# Patient Record
Sex: Female | Born: 1960 | Race: White | Hispanic: No | Smoking: Never smoker
Health system: Southern US, Community
[De-identification: ages and names within clinical notes are randomized; demographics above are authoritative.]

## PROBLEM LIST (undated history)

## (undated) DIAGNOSIS — F419 Anxiety disorder, unspecified: Secondary | ICD-10-CM

## (undated) DIAGNOSIS — R55 Syncope and collapse: Secondary | ICD-10-CM

## (undated) DIAGNOSIS — D649 Anemia, unspecified: Secondary | ICD-10-CM

## (undated) DIAGNOSIS — F32A Depression, unspecified: Secondary | ICD-10-CM

## (undated) DIAGNOSIS — E7800 Pure hypercholesterolemia, unspecified: Secondary | ICD-10-CM

## (undated) DIAGNOSIS — K219 Gastro-esophageal reflux disease without esophagitis: Secondary | ICD-10-CM

## (undated) DIAGNOSIS — IMO0002 Reserved for concepts with insufficient information to code with codable children: Secondary | ICD-10-CM

## (undated) DIAGNOSIS — F329 Major depressive disorder, single episode, unspecified: Secondary | ICD-10-CM

## (undated) DIAGNOSIS — R12 Heartburn: Secondary | ICD-10-CM

## (undated) DIAGNOSIS — I959 Hypotension, unspecified: Secondary | ICD-10-CM

## (undated) DIAGNOSIS — K589 Irritable bowel syndrome without diarrhea: Secondary | ICD-10-CM

## (undated) HISTORY — DX: Irritable bowel syndrome, unspecified: K58.9

## (undated) HISTORY — PX: TONSILLECTOMY AND ADENOIDECTOMY: SUR1326

## (undated) HISTORY — DX: Pure hypercholesterolemia, unspecified: E78.00

## (undated) HISTORY — DX: Anemia, unspecified: D64.9

## (undated) HISTORY — DX: Reserved for concepts with insufficient information to code with codable children: IMO0002

## (undated) HISTORY — DX: Gastro-esophageal reflux disease without esophagitis: K21.9

## (undated) HISTORY — DX: Anxiety disorder, unspecified: F41.9

## (undated) HISTORY — DX: Heartburn: R12

---

## 2003-05-03 HISTORY — PX: CARPAL TUNNEL RELEASE: SHX101

## 2003-10-09 ENCOUNTER — Ambulatory Visit (HOSPITAL_BASED_OUTPATIENT_CLINIC_OR_DEPARTMENT_OTHER): Admission: RE | Admit: 2003-10-09 | Discharge: 2003-10-09 | Payer: Self-pay | Admitting: Orthopedic Surgery

## 2006-11-20 ENCOUNTER — Encounter: Payer: Self-pay | Admitting: Internal Medicine

## 2007-05-28 ENCOUNTER — Encounter: Payer: Self-pay | Admitting: Internal Medicine

## 2007-05-30 ENCOUNTER — Encounter: Payer: Self-pay | Admitting: Internal Medicine

## 2008-07-31 ENCOUNTER — Encounter: Payer: Self-pay | Admitting: Internal Medicine

## 2008-09-12 ENCOUNTER — Encounter: Payer: Self-pay | Admitting: Internal Medicine

## 2009-07-01 ENCOUNTER — Encounter: Payer: Self-pay | Admitting: Internal Medicine

## 2009-07-31 ENCOUNTER — Encounter: Payer: Self-pay | Admitting: Internal Medicine

## 2009-08-05 DIAGNOSIS — R55 Syncope and collapse: Secondary | ICD-10-CM | POA: Insufficient documentation

## 2009-08-05 DIAGNOSIS — F329 Major depressive disorder, single episode, unspecified: Secondary | ICD-10-CM

## 2009-08-05 DIAGNOSIS — F32A Depression, unspecified: Secondary | ICD-10-CM | POA: Insufficient documentation

## 2009-08-07 ENCOUNTER — Ambulatory Visit: Payer: Self-pay | Admitting: Internal Medicine

## 2009-08-18 ENCOUNTER — Encounter: Payer: Self-pay | Admitting: Internal Medicine

## 2009-08-18 ENCOUNTER — Ambulatory Visit: Payer: Self-pay

## 2009-08-18 ENCOUNTER — Ambulatory Visit: Payer: Self-pay | Admitting: Cardiovascular Disease

## 2009-08-18 ENCOUNTER — Ambulatory Visit (HOSPITAL_COMMUNITY): Admission: RE | Admit: 2009-08-18 | Discharge: 2009-08-18 | Payer: Self-pay | Admitting: Internal Medicine

## 2009-08-25 DIAGNOSIS — R87619 Unspecified abnormal cytological findings in specimens from cervix uteri: Secondary | ICD-10-CM

## 2009-08-25 DIAGNOSIS — IMO0002 Reserved for concepts with insufficient information to code with codable children: Secondary | ICD-10-CM

## 2009-08-25 HISTORY — DX: Unspecified abnormal cytological findings in specimens from cervix uteri: R87.619

## 2009-08-25 HISTORY — DX: Reserved for concepts with insufficient information to code with codable children: IMO0002

## 2009-11-16 ENCOUNTER — Encounter (INDEPENDENT_AMBULATORY_CARE_PROVIDER_SITE_OTHER): Payer: Self-pay | Admitting: *Deleted

## 2010-06-01 NOTE — Letter (Signed)
Summary: [facility] Note  [facility] Office Note   Imported By: Roderic Ovens 08/12/2009 10:53:04  _____________________________________________________________________  External Attachment:    Type:   Image     Comment:   External Document

## 2010-06-01 NOTE — Letter (Signed)
Summary: [facility] Office Note 2006 - 2008  [facility] Office Note 2006 - 2008   Imported By: Roderic Ovens 08/12/2009 10:55:00  _____________________________________________________________________  External Attachment:    Type:   Image     Comment:   External Document

## 2010-06-01 NOTE — Letter (Signed)
Summary: [facility]  [facility] Note   Imported By: Roderic Ovens 08/12/2009 10:56:55  _____________________________________________________________________  External Attachment:    Type:   Image     Comment:   External Document

## 2010-06-01 NOTE — Letter (Signed)
Summary: [facility] Note  [facility] Note   Imported By: Roderic Ovens 08/12/2009 10:51:19  _____________________________________________________________________  External Attachment:    Type:   Image     Comment:   External Document

## 2010-06-01 NOTE — Letter (Signed)
Summary: Appointment - Missed  [facility], Main Office  [address]   [place], [place] [address]   Phone: [phone]  Fax: [phone]     November 16, 2009 MRN: [id number]   HANIA CERONE [address] [place], [place]  [address]   Dear Ms. Dougher,  Our records indicate you missed your appointment on 11/04/09 with Dr Johney Frame. It is very important that we reach you to reschedule this appointment. We look forward to participating in your health care needs. Please contact us at the number listed above at your earliest convenience to reschedule this appointment.     Sincerely,   Ruel Favors Scheduling Team

## 2010-06-01 NOTE — Letter (Signed)
Summary: [facility] Note  [facility] Office Note   Imported By: Roderic Ovens 08/12/2009 10:52:37  _____________________________________________________________________  External Attachment:    Type:   Image     Comment:   External Document

## 2010-06-01 NOTE — Assessment & Plan Note (Signed)
Summary: nep/syncope/per [name]/jss   Visit Type:  Initial Consult Primary Provider:  Janae Sauce, MD  CC:  syncope.  History of Present Illness: Ms Rebecca Summers is a pleasant 50 yo WF with recurrent syncope who presents today for EP consultation.  She reports having her first episode of syncope 1992 while sitting on the couch when her father died.  She did well until 2-3 years ago.  He reports that while having severe abdominal cramping and walking to the bathroom during the middle of the night, she had brief LOC.  She then was at the Vets office with her dog.  She observe the dog have pain.  She became diaphoretic and had LOC.  She reports that it took her several minutes to regain composure. Her most recent episode of syncope occured 1 month ago.  She reports having epigastric discomfort after eating dinner.  She was putting away dishes while having epigastric pain.  She bent over to pickup something on the floor when she became lightheaded.  She went to sit down and had loss of consciousness.  She fell and hit her head.  It took her several minutes to regain composure.  Her spouse was very scared because he had difficulty arousing her.  She says that her spouse found that she was making "strange noises" but did not observe seizure activity.  She did not bite her tongue and did not have incontinence.  She then regained composure and had a neighbor who was a nurse come over to the house.  She reports that her vitals were "ok" at that time.  She has had no further episodes of syncope since that time.   She denies palpitations, orthopnea, PND, edema, or other symptoms. She exercises regularly without exertional chest pain or SOB.  Current Medications (verified): 1)  Benadryl 25 Mg Tabs (Diphenhydramine Hcl) .... As Needed 2)  Zyrtec Allergy 10 Mg Tbdp (Cetirizine Hcl) .... 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 Works for [employer]. Tobacco Use - No.  Alcohol Use - yes 1 beverage per week Regular Exercise - yes Drug Use - no  Review of Systems       All systems are reviewed and negative except as listed in the HPI.  In addition, she reports IBS type sypmtoms of lose/ watery stools.  She also reports significant anxiety.   Vital Signs:  Patient profile:   50 year old female Height:      64 inches Weight:      157 pounds BMI:     27.05 Pulse rate:   66 / minute BP sitting:   120 / 80  (left arm)  Vitals Entered By: Laurance Flatten CMA (August 07, 2009 11:00 AM)  Physical Exam  General:  Well developed, well nourished, in no acute distress. Head:  normocephalic and atraumatic Eyes:  PERRLA/EOM intact; conjunctiva and lids normal. Mouth:  Teeth, gums and palate normal. Oral mucosa normal. Neck:  Neck supple, no JVD. No masses, thyromegaly or abnormal cervical nodes. Lungs:  Clear bilaterally to auscultation and percussion. Heart:  Non-displaced PMI, chest non-tender; regular  rate and rhythm, S1, S2 without murmurs, rubs or gallops. Carotid upstroke normal, no bruit. Normal abdominal aortic size, no bruits. Femorals normal pulses, no bruits. Pedals normal pulses. No edema, no varicosities. Abdomen:  Bowel sounds positive; abdomen soft and non-tender without masses, organomegaly, or hernias noted. No hepatosplenomegaly. Msk:  Back normal, normal gait. Muscle strength and tone normal. Pulses:  pulses normal in all 4 extremities Extremities:  No clubbing or cyanosis. Neurologic:  Alert and oriented x 3.  strength/sensation are  intact Skin:  Intact without lesions or rashes. Cervical Nodes:  no significant adenopathy Psych:  Normal affect.   EKG  Procedure date:  08/07/2009  Findings:      sinus rhythm 66 bpm, PR 210, QT 410, otherwise normal ekg  Impression & Recommendations:  Problem # 1:  SYNCOPE (ICD-780.2) The patient has a h/o recurrent syncope.  Her history supports vasovagal syncope.  She has PR prolongation by EKG suggesting hightened vagal tone.  She has no family history of arrhythmia or sudden death.  At this point, I would recommend a TTE to evaluate for structual heart disease.  If this is normal, then I would not recommend futher testing right now.  We discussed tilt testing as a possibility, however, pt wishes to defer tilt testing.  I am not sure that tilt testing would offer much additional information at this time.  Lifestyle modification including increased salt intact and adequate hydration were discussed.  In addition, the patient was made aware of DMV's recommendation of no driving for 6 months following syncope.  I will see her again in 3 months. She will contact my office if further syncope occurs.  Other Orders: Echocardiogram (Echo)  Patient Instructions: 1)  Your physician recommends that you schedule a follow-up appointment in: 3 months with Dr Johney Frame 2)  Your physician has requested that you have an echocardiogram.  Echocardiography is a painless test that uses sound waves to create images of your heart. It provides your doctor with information about the size and shape of your heart and how well your heart's chambers and valves are working.  This procedure takes approximately one hour. There are no restrictions for this procedure. 3)  Increase salts and fluids in diet

## 2010-09-17 NOTE — Op Note (Signed)
NAMEPERRI, Rebecca Summers                           ACCOUNT NO.:  [id number]   MEDICAL RECORD NO.:  [id number]                   PATIENT TYPE:  AMB   LOCATION:  [facility]                                  FACILITY:  [facility]   PHYSICIAN:  Katy Fitch. Naaman Plummer., M.D.          DATE OF BIRTH:  Jun 13, 1960   DATE OF PROCEDURE:  10/09/2003  DATE OF DISCHARGE:                                 OPERATIVE REPORT   PREOPERATIVE DIAGNOSES:  Entrapment neuropathy of median nerve, right carpal  tunnel.   POSTOPERATIVE DIAGNOSES:  Entrapment neuropathy of median nerve, right  carpal tunnel.   OPERATION PERFORMED:  Release of right transverse carpal ligament.   SURGEON:  Katy Fitch. Sypher, M.D.   ASSISTANT:  Jonni Sanger, P.A.   ANESTHESIA:  General by LMA.   SUPERVISING ANESTHESIOLOGIST:  Bedelia Person, M.D.   INDICATIONS FOR PROCEDURE:  Toshia Larkin is a 50 year old woman referred  for hand discomfort and numbness.  Clinical examination suggested carpal  tunnel syndrome.  Electrodiagnostic studies completed by Laurier Nancy, M.D. revealed rather severe right carpal tunnel syndrome.  Due to  failure to respond to nonoperative measures, the patient is brought to the  operating room at this time for release of her right transverse carpal  ligament.   DESCRIPTION OF PROCEDURE:  Layan Zalenski was brought to the operating room  and placed in supine position upon the operating table.  Following induction  of general anesthesia by LMA, the right arm was prepped with Betadine soap  and solution and sterilely draped.  Following exsanguination of the right  arm with an Esmarch bandage, an arterial tourniquet on the proximal brachium  was inflated to 220 mmHg.  The procedure commenced with a short incision in  the line of the ring finger in the palm.  Subcutaneous tissues are carefully  divided revealing the palmar fascia.  This was split longitudinally to  reveal the common sensory branch of the median  nerve and the superficial  palmar arch.  The median nerve proper was identified and gently separated  from the transverse carpal ligament.  The ligament was released along its  ulnar border extending to the distal forearm.  This widely opened the carpal  canal.  No masses or other predicaments were noted.  Bleeding points along  the margin of the wound did not require electrocautery.  The wound was then  repaired with intradermal 3-0 Prolene suture.  A compressive dressing was  applied with a volar plaster splint maintaining the wrist in five degrees  dorsiflexion.   For aftercare Ms. Folger was given a prescription for Percocet 5 mg one or  two tablets by mouth every four to six hours as needed for pain, 20 tablets  without refill.  The patient return to our office for follow-up in  approximately one week.  In the interim she is encouraged to elevate her  hand and keep the dressing  dry.  She is advised to begin immediate range of  motion exercise.                                              Katy Fitch Naaman Plummer., M.D.   RVS/MEDQ  D:  10/09/2003  T:  10/10/2003  Job:  [id number]

## 2012-11-22 ENCOUNTER — Encounter: Payer: Self-pay | Admitting: *Deleted

## 2012-11-23 ENCOUNTER — Encounter: Payer: Self-pay | Admitting: Obstetrics and Gynecology

## 2012-11-23 ENCOUNTER — Ambulatory Visit: Payer: Self-pay | Admitting: Obstetrics and Gynecology

## 2013-04-23 ENCOUNTER — Other Ambulatory Visit: Payer: Self-pay | Admitting: Orthopedic Surgery

## 2013-05-01 ENCOUNTER — Encounter (HOSPITAL_BASED_OUTPATIENT_CLINIC_OR_DEPARTMENT_OTHER): Payer: Self-pay | Admitting: *Deleted

## 2013-05-01 NOTE — Progress Notes (Signed)
No labs needed

## 2013-05-02 ENCOUNTER — Emergency Department (INDEPENDENT_AMBULATORY_CARE_PROVIDER_SITE_OTHER): Payer: Commercial Managed Care - PPO

## 2013-05-02 ENCOUNTER — Encounter (HOSPITAL_COMMUNITY): Payer: Self-pay | Admitting: Emergency Medicine

## 2013-05-02 ENCOUNTER — Emergency Department (HOSPITAL_COMMUNITY)
Admission: EM | Admit: 2013-05-02 | Discharge: 2013-05-02 | Disposition: A | Discharge status: Home / Self Care | Payer: Commercial Managed Care - PPO | Source: Home / Self Care | Attending: Emergency Medicine | Admitting: Emergency Medicine

## 2013-05-02 DIAGNOSIS — J45909 Unspecified asthma, uncomplicated: Secondary | ICD-10-CM

## 2013-05-02 MED ORDER — ALBUTEROL SULFATE HFA 108 (90 BASE) MCG/ACT IN AERS
2.0000 | INHALATION_SPRAY | Freq: Four times a day (QID) | RESPIRATORY_TRACT | Status: DC
Start: 2013-05-02 — End: 2017-07-23

## 2013-05-02 MED ORDER — HYDROCOD POLST-CHLORPHEN POLST 10-8 MG/5ML PO LQCR: 5.0000 mL | Freq: Two times a day (BID) | ORAL | Status: DC | PRN

## 2013-05-02 MED ORDER — PREDNISONE 20 MG PO TABS: 20.0000 mg | ORAL_TABLET | Freq: Two times a day (BID) | ORAL | Status: DC

## 2013-05-02 NOTE — ED Notes (Signed)
Cough for 1 month; minimal improvement w completed course of antibiotics, concern for bronchitis; having general anesthesia for hand surgery 1-6

## 2013-05-02 NOTE — ED Provider Notes (Signed)
Chief Complaint:   Chief Complaint  Patient presents with  . Cough    History of Present Illness:   Rebecca Summers is a 53 year old female who has had a one-month history of cough. This began with a viral flu type illness with nonproductive cough and subjective fever. About 3 weeks ago she went to another urgent care. She was treated with amoxicillin and a steroid nasal spray. She did not have a chest x-ray at that time. The cough has persisted since then. It now is nonproductive. She's had some wheezing. She still having some subjective fever and some sweats. She's also had some headache. She denies any nasal congestion, drainage, postnasal drip. She's had no pain in the chest, facial pain, sore throat, or history of asthma. She has no reflux symptoms. No history of allergies and she denies any sneezing, nasal itching, or itchy, watery. She plans to have general anesthesia next week for carpal tunnel syndrome.  Review of Systems:  Other than noted above, the patient denies any of the following symptoms: Systemic:  No fevers, chills, sweats, weight loss or gain, or fatigue. ENT:  No nasal congestion, sneezing, itching, postnasal drip, sinus pressure, headache, sore throat, or hoarseness. Lungs:  No wheezing, shortness of breath, chest tightness or congestion. Heart:  No chest pain, tightness, pressure, PND, orthopnea, or ankle edema. GI:  No indigestion, heartburn, waterbrash, burping, abdominal pain, nausea, or vomiting.  [place]:  Past medical history, family history, social history, meds, and allergies were reviewed.  Specifically, there is no history of asthma, allergies, reflux esophagitis or cigarette smoking.  Physical Exam:   Vital signs:  BP 136/86  Pulse 90  Temp(Src) 99.4 F (37.4 C) (Oral)  Resp 19  SpO2 95% General:  Alert and oriented.  In no distress.  Skin warm and dry. ENT: TMs and ear canals normal.  Nasal mucosa normal, without drainage.  Pharynx clear without exudate or  drainage.  No intraoral lesions. Neck:  No adenopathy, tenderness or mass.  No JVD. Lungs:  No respiratory distress.  Breath sounds clear and equal bilaterally.  No wheezes, rales or rhonchi. Heart:  Regular rhythm, no gallops or murmers.  No pedal edema. Abdomon:  Soft and nontender.  No organomegaly or mass.  Radiology:  Dg Chest 2 View  05/02/2013   CLINICAL DATA:  Sick for a month with a deep cough, nonproductive. Low grade fever.  EXAM: CHEST  2 VIEW  COMPARISON:  None.  FINDINGS: The heart size and mediastinal contours are within normal limits. Both lungs are clear. The visualized skeletal structures are unremarkable.  IMPRESSION: No active cardiopulmonary disease.   Electronically Signed   By: Kathreen Devoid   On: 05/02/2013 09:47   Assessment:  The encounter diagnosis was Reactive airway disease.  Differential diagnosis includes asthma, reflux, postnasal drip, allergies, or sinusitis.  Plan:   1.  Meds:  The following meds were prescribed:   Discharge Medication List as of 05/02/2013 10:02 AM    START taking these medications   Details  albuterol (PROVENTIL HFA;VENTOLIN HFA) 108 (90 BASE) MCG/ACT inhaler Inhale 2 puffs into the lungs 4 (four) times daily., Starting 05/02/2013, Until Discontinued, Normal    chlorpheniramine-HYDROcodone (TUSSIONEX) 10-8 MG/5ML LQCR Take 5 mLs by mouth every 12 (twelve) hours as needed for cough., Starting 05/02/2013, Until Discontinued, Normal    predniSONE (DELTASONE) 20 MG tablet Take 1 tablet (20 mg total) by mouth 2 (two) times daily., Starting 05/02/2013, Until Discontinued, Normal  2.  Patient Education/Counseling:  The patient was given appropriate handouts, self care instructions, and instructed in symptomatic relief.   3.  Follow up:  The patient was told to follow up if no better in 3 to 4 days, if becoming worse in any way, and given some red flag symptoms such as fever or difficulty breathing which would prompt immediate return.  Follow up  here or with a primary care physician. She was given the name of Dr. Mickey Farber.       Harden Mo, MD 05/02/13 1025

## 2013-05-02 NOTE — Discharge Instructions (Signed)
Using Your Inhaler 1. Take the cap off the mouthpiece. 2. Shake the inhaler for 5 seconds. 3. Turn the inhaler so the bottle is above the mouthpiece. Hold it away from your mouth, at a distance of the width of 2 fingers. 4. Open your mouth widely, and tilt your head back slightly. Let your breath out. 5. Take a deep breath in slowly through your mouth. At the same time, push down on the bottle 1 time. You will feel the medicine enter your mouth and throat as you breathe. 6. Continue to take a deep breath in very slowly. 7. After you have breathed in completely, hold your breath for 10 seconds. This will help the medicine to settle in your lungs. If you cannot hold your breath for 10 seconds, hold it for as long as you can before you breathe out. 8. If your doctor has told you to take more than 1 puff, wait at least 1 minute between puffs. This will help you get the best results from your medicine. 9. If you use a steroid inhaler, rinse out your mouth after each dose. 10. Wash your inhaler once a day. Remove the bottle from the mouthpiece. Rinse the mouthpiece and cap with warm water. Dry everything well before you put the inhaler back together. Document Released: 01/26/2008 Document Revised: 07/11/2011 Document Reviewed: 02/03/2009 [facility] Patient Information 2014 [place].

## 2013-05-06 NOTE — H&P (Signed)
  Rebecca Summers is an 53 y.o. female.   Chief Complaint: c/o chronic and progressive numbness and tingling of the left hand HPI: Rebecca Summers presents for evaluation of left hand numbness. She is s/p release of her right transverse carpal ligament more than 10 years ago. She has had evolving symptoms of numbness and weakness of her left hand. She understands this is likely left carpal tunnel syndrome. She has been very active during the holidays. She has no history of diabetes, thyroid disease or gout. There is no history of triggering. She has been wearing a night splint without relief. She has no symptoms of discomfort in her neck and no radicular symptoms.     Past Medical History  Diagnosis Date  . Abnormal Pap smear 08/25/09    rare atypical glandular cells  . Depression   . Vagal reaction     hx    Past Surgical History  Procedure Laterality Date  . Tonsillectomy and adenoidectomy    . Carpal tunnel release  2005    Family History  Problem Relation Age of Onset  . Hypertension Mother   . Heart disease Mother   . Depression Father   . Anxiety disorder Father   . Depression Sister   . Anxiety disorder Sister   . Depression Brother   . Anxiety disorder Brother    Social History:  reports that she has never smoked. She does not have any smokeless tobacco history on file. She reports that she drinks alcohol. She reports that she does not use illicit drugs.  Allergies: No Known Allergies  No prescriptions prior to admission    No results found for this or any previous visit (from the past 48 hour(s)).  No results found.   Pertinent items are noted in HPI.  Height 5\' 4"  (1.626 m), weight 77.111 kg (170 lb).  General appearance: alert Head: Normocephalic, without obvious abnormality Neck: supple, symmetrical, trachea midline Resp: clear to auscultation bilaterally Cardio: regular rate and rhythm GI: normal findings: bowel sounds normal Extremities: Inspection of  her hands reveals no thenar atrophy. Her sweat patterns and dermatoglyphics are preserved. She has a positive Tinel's sign over her left median nerve at the wrist. Her pulses and capillary refill are intact. She has no sign of STS of her fingers, thumb or first dorsal compartment. Her motor and sensory exam is preserved.   X-rays of her left hand and wrist demonstrate normal bony anatomy.   Dr. Zebedee Iba repeated her electrodiagnostic studies. These confirm significant left carpal tunnel syndrome with sensory amplitude of 10 microvolts. She has completely normal parameters on the right.   Pulses: 2+ and symmetric Skin: normal Neurologic: Grossly normal    Assessment/Plan Impression: Left CTS  Plan: To the OR for left CTR.The procedure, risks,benefits and post-op course were discussed with the patient at length and they were in agreement with the plan.  DASNOIT,Irish Breisch J 05/06/2013, 12:22 PM   H&P documentation: 05/07/2013  -History and Physical Reviewed  -Patient has been re-examined  -No change in the plan of care  Cammie Sickle, MD

## 2013-05-07 ENCOUNTER — Ambulatory Visit (HOSPITAL_BASED_OUTPATIENT_CLINIC_OR_DEPARTMENT_OTHER)
Admission: RE | Admit: 2013-05-07 | Discharge: 2013-05-07 | Disposition: A | Discharge status: Home / Self Care | Payer: Commercial Managed Care - PPO | Source: Ambulatory Visit | Attending: Orthopedic Surgery | Admitting: Orthopedic Surgery

## 2013-05-07 ENCOUNTER — Ambulatory Visit (HOSPITAL_BASED_OUTPATIENT_CLINIC_OR_DEPARTMENT_OTHER): Payer: Commercial Managed Care - PPO | Admitting: Anesthesiology

## 2013-05-07 ENCOUNTER — Encounter (HOSPITAL_BASED_OUTPATIENT_CLINIC_OR_DEPARTMENT_OTHER): Payer: Self-pay | Admitting: *Deleted

## 2013-05-07 ENCOUNTER — Encounter (HOSPITAL_BASED_OUTPATIENT_CLINIC_OR_DEPARTMENT_OTHER): Payer: Commercial Managed Care - PPO | Admitting: Anesthesiology

## 2013-05-07 ENCOUNTER — Encounter (HOSPITAL_BASED_OUTPATIENT_CLINIC_OR_DEPARTMENT_OTHER)
Admission: RE | Disposition: A | Discharge status: Home / Self Care | Payer: Self-pay | Source: Ambulatory Visit | Attending: Orthopedic Surgery

## 2013-05-07 DIAGNOSIS — F329 Major depressive disorder, single episode, unspecified: Secondary | ICD-10-CM | POA: Insufficient documentation

## 2013-05-07 DIAGNOSIS — F3289 Other specified depressive episodes: Secondary | ICD-10-CM | POA: Insufficient documentation

## 2013-05-07 DIAGNOSIS — G5600 Carpal tunnel syndrome, unspecified upper limb: Secondary | ICD-10-CM | POA: Insufficient documentation

## 2013-05-07 HISTORY — DX: Major depressive disorder, single episode, unspecified: F32.9

## 2013-05-07 HISTORY — PX: CARPAL TUNNEL RELEASE: SHX101

## 2013-05-07 HISTORY — DX: Depression, unspecified: F32.A

## 2013-05-07 HISTORY — DX: Syncope and collapse: R55

## 2013-05-07 LAB — POCT HEMOGLOBIN-HEMACUE: Hemoglobin: 13.4 g/dL (ref 12.0–15.0)

## 2013-05-07 SURGERY — CARPAL TUNNEL RELEASE
Anesthesia: General | Site: Wrist | Laterality: Left

## 2013-05-07 MED ORDER — LACTATED RINGERS IV SOLN
INTRAVENOUS | Status: DC
  Administered 2013-05-07: 08:00:00 via INTRAVENOUS

## 2013-05-07 MED ORDER — MIDAZOLAM HCL 2 MG/2ML IJ SOLN: 1.0000 mg | INTRAMUSCULAR | Status: DC | PRN

## 2013-05-07 MED ORDER — FENTANYL CITRATE 0.05 MG/ML IJ SOLN: 25.0000 ug | INTRAMUSCULAR | Status: DC | PRN

## 2013-05-07 MED ORDER — OXYCODONE HCL 5 MG PO TABS: 5.0000 mg | ORAL_TABLET | Freq: Once | ORAL | Status: DC | PRN

## 2013-05-07 MED ORDER — FENTANYL CITRATE 0.05 MG/ML IJ SOLN: 50.0000 ug | Freq: Once | INTRAMUSCULAR | Status: DC

## 2013-05-07 MED ORDER — LIDOCAINE HCL (CARDIAC) 20 MG/ML IV SOLN
INTRAVENOUS | Status: DC | PRN
  Administered 2013-05-07: 100 mg via INTRAVENOUS

## 2013-05-07 MED ORDER — ONDANSETRON HCL 4 MG/2ML IJ SOLN
INTRAMUSCULAR | Status: DC | PRN
  Administered 2013-05-07: 4 mg via INTRAVENOUS

## 2013-05-07 MED ORDER — DEXAMETHASONE SODIUM PHOSPHATE 10 MG/ML IJ SOLN
INTRAMUSCULAR | Status: DC | PRN
  Administered 2013-05-07: 5 mg via INTRAVENOUS

## 2013-05-07 MED ORDER — LIDOCAINE HCL 2 % IJ SOLN
INTRAMUSCULAR | Status: DC | PRN
  Administered 2013-05-07: 4 mL

## 2013-05-07 MED ORDER — PROPOFOL 10 MG/ML IV BOLUS
INTRAVENOUS | Status: DC | PRN
  Administered 2013-05-07: 200 mg via INTRAVENOUS

## 2013-05-07 MED ORDER — MIDAZOLAM HCL 5 MG/5ML IJ SOLN
INTRAMUSCULAR | Status: DC | PRN
  Administered 2013-05-07: 2 mg via INTRAVENOUS

## 2013-05-07 MED ORDER — FENTANYL CITRATE 0.05 MG/ML IJ SOLN
INTRAMUSCULAR | Status: AC
  Filled 2013-05-07: qty 4

## 2013-05-07 MED ORDER — FENTANYL CITRATE 0.05 MG/ML IJ SOLN
INTRAMUSCULAR | Status: DC | PRN
  Administered 2013-05-07: 100 ug via INTRAVENOUS

## 2013-05-07 MED ORDER — OXYCODONE HCL 5 MG/5ML PO SOLN: 5.0000 mg | Freq: Once | ORAL | Status: DC | PRN

## 2013-05-07 MED ORDER — ACETAMINOPHEN-CODEINE #3 300-30 MG PO TABS: 1.0000 | ORAL_TABLET | ORAL | Status: DC | PRN

## 2013-05-07 MED ORDER — CHLORHEXIDINE GLUCONATE 4 % EX LIQD: 60.0000 mL | Freq: Once | CUTANEOUS | Status: DC

## 2013-05-07 MED ORDER — MIDAZOLAM HCL 2 MG/2ML IJ SOLN
INTRAMUSCULAR | Status: AC
  Filled 2013-05-07: qty 2

## 2013-05-07 MED ORDER — FENTANYL CITRATE 0.05 MG/ML IJ SOLN: 50.0000 ug | INTRAMUSCULAR | Status: DC | PRN

## 2013-05-07 MED ORDER — PROMETHAZINE HCL 25 MG/ML IJ SOLN: 6.2500 mg | INTRAMUSCULAR | Status: DC | PRN

## 2013-05-07 SURGICAL SUPPLY — 40 items
BANDAGE ADH SHEER 1  50/CT (GAUZE/BANDAGES/DRESSINGS) IMPLANT
BANDAGE ELASTIC 3 VELCRO ST LF (GAUZE/BANDAGES/DRESSINGS) ×2 IMPLANT
BLADE SURG 15 STRL LF DISP TIS (BLADE) ×1 IMPLANT
BLADE SURG 15 STRL SS (BLADE) ×2
BNDG CMPR 9X4 STRL LF SNTH (GAUZE/BANDAGES/DRESSINGS)
BNDG COHESIVE 3X5 TAN STRL LF (GAUZE/BANDAGES/DRESSINGS) IMPLANT
BNDG ESMARK 4X9 LF (GAUZE/BANDAGES/DRESSINGS) IMPLANT
BRUSH SCRUB EZ PLAIN DRY (MISCELLANEOUS) ×2 IMPLANT
CORDS BIPOLAR (ELECTRODE) IMPLANT
COVER MAYO STAND STRL (DRAPES) ×2 IMPLANT
COVER TABLE BACK 60X90 (DRAPES) ×2 IMPLANT
CUFF TOURNIQUET SINGLE 18IN (TOURNIQUET CUFF) IMPLANT
DECANTER SPIKE VIAL GLASS SM (MISCELLANEOUS) IMPLANT
DRAPE EXTREMITY T 121X128X90 (DRAPE) ×2 IMPLANT
DRAPE SURG 17X23 STRL (DRAPES) ×2 IMPLANT
GLOVE BIO SURGEON STRL SZ 6.5 (GLOVE) ×1 IMPLANT
GLOVE BIOGEL M STRL SZ7.5 (GLOVE) ×2 IMPLANT
GLOVE BIOGEL PI IND STRL 7.0 (GLOVE) IMPLANT
GLOVE BIOGEL PI INDICATOR 7.0 (GLOVE) ×2
GLOVE ORTHO TXT STRL SZ7.5 (GLOVE) ×2 IMPLANT
GOWN STRL REUS W/ TWL LRG LVL3 (GOWN DISPOSABLE) ×1 IMPLANT
GOWN STRL REUS W/TWL LRG LVL3 (GOWN DISPOSABLE) ×2
GOWN STRL REUS W/TWL XL LVL3 (GOWN DISPOSABLE) ×4 IMPLANT
NEEDLE 27GAX1X1/2 (NEEDLE) IMPLANT
PACK BASIN DAY SURGERY FS (CUSTOM PROCEDURE TRAY) ×2 IMPLANT
PAD CAST 3X4 CTTN HI CHSV (CAST SUPPLIES) ×1 IMPLANT
PADDING CAST ABS 4INX4YD NS (CAST SUPPLIES) ×1
PADDING CAST ABS COTTON 4X4 ST (CAST SUPPLIES) ×1 IMPLANT
PADDING CAST COTTON 3X4 STRL (CAST SUPPLIES) ×2
SPLINT PLASTER CAST XFAST 3X15 (CAST SUPPLIES) ×5 IMPLANT
SPLINT PLASTER XTRA FASTSET 3X (CAST SUPPLIES) ×5
SPONGE GAUZE 4X4 12PLY (GAUZE/BANDAGES/DRESSINGS) ×2 IMPLANT
STOCKINETTE 4X48 STRL (DRAPES) ×2 IMPLANT
STRIP CLOSURE SKIN 1/2X4 (GAUZE/BANDAGES/DRESSINGS) ×2 IMPLANT
SUT PROLENE 3 0 PS 2 (SUTURE) ×2 IMPLANT
SYR 3ML 23GX1 SAFETY (SYRINGE) IMPLANT
SYR CONTROL 10ML LL (SYRINGE) IMPLANT
TOWEL OR 17X24 6PK STRL BLUE (TOWEL DISPOSABLE) ×2 IMPLANT
TRAY DSU PREP LF (CUSTOM PROCEDURE TRAY) ×2 IMPLANT
UNDERPAD 30X30 INCONTINENT (UNDERPADS AND DIAPERS) ×2 IMPLANT

## 2013-05-07 NOTE — Anesthesia Preprocedure Evaluation (Signed)
Anesthesia Evaluation  Patient identified by MRN, date of birth, ID band Patient awake    Reviewed: Allergy & Precautions, H&P , NPO status , Patient's Chart, lab work & pertinent test results  Airway Mallampati: I TM Distance: >3 FB Neck ROM: Full    Dental   Pulmonary  Inhaler usage breath sounds clear to auscultation        Cardiovascular Rhythm:Regular Rate:Normal     Neuro/Psych Depression    GI/Hepatic   Endo/Other    Renal/GU      Musculoskeletal   Abdominal   Peds  Hematology   Anesthesia Other Findings   Reproductive/Obstetrics                           Anesthesia Physical Anesthesia Plan  ASA: II  Anesthesia Plan: General   Post-op Pain Management:    Induction: Intravenous  Airway Management Planned: LMA  Additional Equipment:   Intra-op Plan:   Post-operative Plan: Extubation in OR  Informed Consent: I have reviewed the patients History and Physical, chart, labs and discussed the procedure including the risks, benefits and alternatives for the proposed anesthesia with the patient or authorized representative who has indicated his/her understanding and acceptance.     Plan Discussed with: CRNA and Surgeon  Anesthesia Plan Comments:         Anesthesia Quick Evaluation

## 2013-05-07 NOTE — Discharge Instructions (Addendum)

## 2013-05-07 NOTE — Anesthesia Postprocedure Evaluation (Signed)
  Anesthesia Post-op Note  Patient: Rebecca Summers  Procedure(s) Performed: Procedure(s): LEFT CARPAL TUNNEL RELEASE (Left)  Patient Location: PACU  Anesthesia Type:General  Level of Consciousness: awake  Airway and Oxygen Therapy: Patient Spontanous Breathing  Post-op Pain: mild  Post-op Assessment: Post-op Vital signs reviewed, Patient's Cardiovascular Status Stable, Respiratory Function Stable, Patent Airway, No signs of Nausea or vomiting and Pain level controlled  Post-op Vital Signs: Reviewed and stable  Complications: No apparent anesthesia complications

## 2013-05-07 NOTE — Transfer of Care (Signed)
Immediate Anesthesia Transfer of Care Note  Patient: Rebecca Summers  Procedure(s) Performed: Procedure(s): LEFT CARPAL TUNNEL RELEASE (Left)  Patient Location: PACU  Anesthesia Type:General  Level of Consciousness: sedated  Airway & Oxygen Therapy: Patient Spontanous Breathing and Patient connected to face mask oxygen  Post-op Assessment: Report given to PACU RN and Post -op Vital signs reviewed and stable  Post vital signs: Reviewed and stable  Complications: No apparent anesthesia complications

## 2013-05-07 NOTE — Anesthesia Procedure Notes (Signed)
Procedure Name: LMA Insertion Date/Time: 05/07/2013 8:36 AM Performed by: Lieutenant Diego Pre-anesthesia Checklist: Patient identified, Emergency Drugs available, Suction available and Patient being monitored Patient Re-evaluated:Patient Re-evaluated prior to inductionOxygen Delivery Method: Circle System Utilized Preoxygenation: Pre-oxygenation with 100% oxygen Intubation Type: IV induction Ventilation: Mask ventilation without difficulty LMA: LMA inserted LMA Size: 4.0 Number of attempts: 1 Airway Equipment and Method: bite block Placement Confirmation: positive ETCO2 and breath sounds checked- equal and bilateral Tube secured with: Tape Dental Injury: Teeth and Oropharynx as per pre-operative assessment

## 2013-05-07 NOTE — Brief Op Note (Signed)
05/07/2013  9:08 AM  PATIENT:  Rebecca Summers  53 y.o. female  PRE-OPERATIVE DIAGNOSIS:  LEFT CARPAL TUNNEL SYNDROME   POST-OPERATIVE DIAGNOSIS:  LEFT CARPAL TUNNEL SYNDROME   PROCEDURE:  Procedure(s): LEFT CARPAL TUNNEL RELEASE (Left)  SURGEON:  Surgeon(s) and Role:    * Cammie Sickle., MD - Primary  PHYSICIAN ASSISTANT:   ASSISTANTS: surgical tech   ANESTHESIA:   general  EBL:  Total I/O In: 800 [I.V.:800] Out: -   BLOOD ADMINISTERED:none  DRAINS: none   LOCAL MEDICATIONS USED:  XYLOCAINE   SPECIMEN:  No Specimen  DISPOSITION OF SPECIMEN:  N/A  COUNTS:  YES  TOURNIQUET:   Total Tourniquet Time Documented: Upper Arm (Left) - 10 minutes Total: Upper Arm (Left) - 10 minutes   DICTATION: .Other Dictation: Dictation Number [id number]  PLAN OF CARE: Discharge to home after PACU  PATIENT DISPOSITION:  PACU - hemodynamically stable.   Delay start of Pharmacological VTE agent (>24hrs) due to surgical blood loss or risk of bleeding: not applicable

## 2013-05-07 NOTE — Op Note (Signed)
[id number] 

## 2013-05-08 ENCOUNTER — Encounter (HOSPITAL_BASED_OUTPATIENT_CLINIC_OR_DEPARTMENT_OTHER): Payer: Self-pay | Admitting: Orthopedic Surgery

## 2013-05-08 NOTE — Op Note (Signed)
[name], [name]               ACCOUNT NO.:  [id number]  MEDICAL RECORD NO.:  [id number]  LOCATION:                                 FACILITY:  PHYSICIAN:  Rebecca Mighty. Evelise Reine, M.D. DATE OF BIRTH:  May 07, 1960  DATE OF PROCEDURE:  05/07/2013 DATE OF DISCHARGE:                              OPERATIVE REPORT   PREOPERATIVE DIAGNOSIS:  Chronic left carpal tunnel syndrome with positive electrodiagnostic studies.  POSTOPERATIVE DIAGNOSIS:  Chronic left carpal tunnel syndrome with positive electrodiagnostic studies.  OPERATION:  Release of left transverse carpal ligament.  OPERATING SURGEON:  Rebecca Mighty. Christopher Glasscock, MD  ASSISTANT:  Surgical technician.  ANESTHESIA:  General by LMA.  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  She has had a history of prior right carpal tunnel syndrome treated 10 years ago with the release of the transverse carpal ligament.  She now returns, requesting similar surgery on the left for chronic left hand numbness.  Clinical examination confirmed the presence of carpal tunnel syndrome.  Electrodiagnostic studies confirmed median neuropathy.  After detailed informed consent, she was brought to the operating room at this time for release of her left transverse carpal ligament.  Preoperatively, she was interviewed by Dr. Chriss Driver from Anesthesia, he recommended general anesthesia by LMA technique.  This was accepted by Rebecca Summers.  DESCRIPTION OF PROCEDURE:  Rebecca Summers was brought to room 2 of the [facility] and placed supine position on the operating table. In room 2 under Dr. Osborne Oman direct supervision, general anesthesia by LMA technique was induced followed by routine Betadine scrub and paint of the left upper extremity.  Pneumatic tourniquet was applied to the proximal left brachium and set at 200 mmHg.  Following exsanguination of the left  arm with Esmarch bandage, arterial tourniquet was inflated to 220 mmHg.  Routine surgical time-out was accomplished followed by proceeding with a 2-cm incision in the line of the ring finger in the palm.  Subcutaneous tissues were carefully divided, revealing the palmar fascia.  This was split longitudinally to reveal the common sensory branch of the median nerve and superficial palmar arch.  Care was taken to identify the flexor tendons in the carpal canal followed by sequential release of the transverse carpal ligament along its ulnar border, extending into the distal forearm.  The volar forearm fascia was released subcutaneously.  No mass or other predicaments were noted.  Bleeding points along the margin of the released ligament were electrocauterized bipolar current followed by repair of the skin with intradermal 3-0 Prolene suture.  Lidocaine was infiltrated along the wound margins for postoperative comfort.  The wound was then dressed with Steri-Strips, sterile gauze, sterile Webril, and a volar plaster splint, maintaining the wrist in 15 degrees of dorsiflexion.  For aftercare, Rebecca Summers was provided prescription for Tylenol with Codeine No. 3 one p.o. q.4-6 hours p.r.n. pain, 20 tablets without refill.     Rebecca Summers, M.D.     RVS/MEDQ  D:  05/07/2013  T:  05/08/2013  Job:  [id number]

## 2013-06-22 ENCOUNTER — Encounter (HOSPITAL_COMMUNITY): Payer: Self-pay | Admitting: Emergency Medicine

## 2013-06-22 ENCOUNTER — Emergency Department (INDEPENDENT_AMBULATORY_CARE_PROVIDER_SITE_OTHER): Payer: Commercial Managed Care - PPO

## 2013-06-22 ENCOUNTER — Emergency Department (INDEPENDENT_AMBULATORY_CARE_PROVIDER_SITE_OTHER)
Admission: EM | Admit: 2013-06-22 | Discharge: 2013-06-22 | Disposition: A | Discharge status: Home / Self Care | Payer: Commercial Managed Care - PPO | Source: Home / Self Care | Attending: Emergency Medicine | Admitting: Emergency Medicine

## 2013-06-22 DIAGNOSIS — S60219A Contusion of unspecified wrist, initial encounter: Secondary | ICD-10-CM

## 2013-06-22 HISTORY — DX: Hypotension, unspecified: I95.9

## 2013-06-22 NOTE — ED Notes (Signed)
C/O slipping on ice this morning, landing on palm of left hand.  C/O proximal left hand pain slightly into wrist (at site of CTR 5 wks ago).  Small area of swelling and light ecchymosis noted to scar region.  Has been applying ice and took IBU.

## 2013-06-22 NOTE — Discharge Instructions (Signed)
Keep wrist still, apply ice, elevate. Use your carpal tunnel brace as needed. This should slowly resolve over the next few days, if it gets any worse or you start to have any new symptoms associated with this injury, you should followup with Dr. Daylene Katayama as soon as possible.    Hematoma A hematoma is a collection of blood under the skin, in an organ, in a body space, in a joint space, or in other tissue. The blood can clot to form a lump that you can see and feel. The lump is often firm and may sometimes become sore and tender. Most hematomas get better in a few days to weeks. However, some hematomas may be serious and require medical care. Hematomas can range in size from very small to very large. CAUSES  A hematoma can be caused by a blunt or penetrating injury. It can also be caused by spontaneous leakage from a blood vessel under the skin. Spontaneous leakage from a blood vessel is more likely to occur in older people, especially those taking blood thinners. Sometimes, a hematoma can develop after certain medical procedures. SIGNS AND SYMPTOMS   A firm lump on the body.  Possible pain and tenderness in the area.  Bruising.Blue, dark blue, purple-red, or yellowish skin may appear at the site of the hematoma if the hematoma is close to the surface of the skin. For hematomas in deeper tissues or body spaces, the signs and symptoms may be subtle. For example, an intra-abdominal hematoma may cause abdominal pain, weakness, fainting, and shortness of breath. An intracranial hematoma may cause a headache or symptoms such as weakness, trouble speaking, or a change in consciousness. DIAGNOSIS  A hematoma can usually be diagnosed based on your medical history and a physical exam. Imaging tests may be needed if your health care provider suspects a hematoma in deeper tissues or body spaces, such as the abdomen, head, or chest. These tests may include ultrasonography or a CT scan.  TREATMENT  Hematomas  usually go away on their own over time. Rarely does the blood need to be drained out of the body. Large hematomas or those that may affect vital organs will sometimes need surgical drainage or monitoring. HOME CARE INSTRUCTIONS   Apply ice to the injured area:   Put ice in a plastic bag.   Place a towel between your skin and the bag.   Leave the ice on for 20 minutes, 2 3 times a day for the first 1 to 2 days.   After the first 2 days, switch to using warm compresses on the hematoma.   Elevate the injured area to help decrease pain and swelling. Wrapping the area with an elastic bandage may also be helpful. Compression helps to reduce swelling and promotes shrinking of the hematoma. Make sure the bandage is not wrapped too tight.   If your hematoma is on a lower extremity and is painful, crutches may be helpful for a couple days.   Only take over-the-counter or prescription medicines as directed by your health care provider. SEEK IMMEDIATE MEDICAL CARE IF:   You have increasing pain, or your pain is not controlled with medicine.   You have a fever.   You have worsening swelling or discoloration.   Your skin over the hematoma breaks or starts bleeding.   Your hematoma is in your chest or abdomen and you have weakness, shortness of breath, or a change in consciousness.  Your hematoma is on your scalp (caused by a  fall or injury) and you have a worsening headache or a change in alertness or consciousness. MAKE SURE YOU:   Understand these instructions.  Will watch your condition.  Will get help right away if you are not doing well or get worse. Document Released: 12/01/2003 Document Revised: 12/19/2012 Document Reviewed: 09/26/2012 [facility] Patient Information 2014 [place].

## 2013-06-22 NOTE — ED Provider Notes (Signed)
CSN: [id number]     Arrival date & time 06/22/13  1021 History   First MD Initiated Contact with Patient 06/22/13 1058     Chief Complaint  Patient presents with  . Hand Injury     (Consider location/radiation/quality/duration/timing/severity/associated sxs/prior Treatment) HPI Comments: 53 year old female presents complaining of left wrist pain. This morning, she slipped on some ice and landed on the palm of her left hand. She had immediate pain in the left wrist. She also developed bruising and swelling of her left wrist. 5 weeks ago, she had carpal tunnel repair done by Dr. Daylene Katayama, this appeared to be completely healed. now, the bruising appears to be where the incision line previously was after the surgery. She has no numbness in her hand and can move her wrist fine. The area is not especially tender. She can move her fingers without difficulty. No other injury.  Patient is a 53 y.o. female presenting with hand injury.  Hand Injury Associated symptoms: no fever     Past Medical History  Diagnosis Date  . Abnormal Pap smear 08/25/09    rare atypical glandular cells  . Depression   . Vagal reaction     hx  . Hypotension    Past Surgical History  Procedure Laterality Date  . Tonsillectomy and adenoidectomy    . Carpal tunnel release  2005  . Carpal tunnel release Left 05/07/2013    Procedure: LEFT CARPAL TUNNEL RELEASE;  Surgeon: Cammie Sickle., MD;  Location: [facility];  Service: Orthopedics;  Laterality: Left;   Family History  Problem Relation Age of Onset  . Hypertension Mother   . Heart disease Mother   . Depression Father   . Anxiety disorder Father   . Depression Sister   . Anxiety disorder Sister   . Depression Brother   . Anxiety disorder Brother    History  Substance Use Topics  . Smoking status: Never Smoker   . Smokeless tobacco: Not on file  . Alcohol Use: No   OB History   Grav Para Term Preterm Abortions TAB SAB Ect Mult Living   1  1 1       1      Review of Systems  Constitutional: Negative for fever and chills.  Eyes: Negative for visual disturbance.  Respiratory: Negative for cough and shortness of breath.   Cardiovascular: Negative for chest pain, palpitations and leg swelling.  Gastrointestinal: Negative for nausea, vomiting and abdominal pain.  Endocrine: Negative for polydipsia and polyuria.  Genitourinary: Negative for dysuria, urgency and frequency.  Musculoskeletal:       See history of present illness  Skin: Negative for rash.  Neurological: Negative for dizziness, weakness and light-headedness.      Allergies  Review of patient's allergies indicates no known allergies.  Home Medications   Current Outpatient Rx  Name  Route  Sig  Dispense  Refill  . albuterol (PROVENTIL HFA;VENTOLIN HFA) 108 (90 BASE) MCG/ACT inhaler   Inhalation   Inhale 2 puffs into the lungs 4 (four) times daily.   1 Inhaler   0   . chlordiazePOXIDE (LIBRIUM) 5 MG capsule   Oral   Take 5 mg by mouth every evening.         . Omeprazole (PRILOSEC PO)   Oral   Take by mouth.         . venlafaxine (EFFEXOR) 37.5 MG tablet   Oral   Take 37.5 mg by mouth daily.         Marland Kitchen  acetaminophen-codeine (TYLENOL #3) 300-30 MG per tablet   Oral   Take 1 tablet by mouth every 4 (four) hours as needed for moderate pain.   20 tablet   0   . chlorpheniramine-HYDROcodone (TUSSIONEX) 10-8 MG/5ML LQCR   Oral   Take 5 mLs by mouth every 12 (twelve) hours as needed for cough.   140 mL   0   . mometasone (NASONEX) 50 MCG/ACT nasal spray   Nasal   Place 2 sprays into the nose daily.         . predniSONE (DELTASONE) 20 MG tablet   Oral   Take 1 tablet (20 mg total) by mouth 2 (two) times daily.   10 tablet   0    BP 152/89  Pulse 87  Temp(Src) 97.9 F (36.6 C) (Oral)  Resp 16  SpO2 96% Physical Exam  Nursing note and vitals reviewed. Constitutional: She is oriented to person, place, and time. Vital signs are  normal. She appears well-developed and well-nourished. No distress.  HENT:  Head: Normocephalic and atraumatic.  Pulmonary/Chest: Effort normal. No respiratory distress.  Musculoskeletal:       Left wrist: She exhibits swelling (there is pronounced ecchymosis and mild swelling of the wrist centralized to the site of the previous incision from the carpal tunnel repair. There is particularly dark line of ecchymosis at the previous incision site. The skin overlying this is intact. ). She exhibits normal range of motion, no tenderness, no bony tenderness, no crepitus and no deformity.  Neurological: She is alert and oriented to person, place, and time. She has normal strength. No sensory deficit. Coordination normal.  Skin: Skin is warm and dry. No rash noted. She is not diaphoretic.  Psychiatric: She has a normal mood and affect. Judgment normal.    ED Course  Procedures (including critical care time) Labs Review Labs Reviewed - No data to display Imaging Review Dg Hand Complete Left  06/22/2013   CLINICAL DATA:  Fall, metacarpal pain  EXAM: LEFT HAND - COMPLETE 3+ VIEW  COMPARISON:  None.  FINDINGS: There is no evidence of fracture or dislocation. There is no evidence of arthropathy or other focal bone abnormality. Soft tissues are unremarkable.  IMPRESSION: No acute osseous finding.   Electronically Signed   By: Daryll Brod M.D.   On: 06/22/2013 11:14      MDM   Final diagnoses:  Traumatic hematoma of wrist    This appears to be a hematoma at the site of her previous surgical incision. I have advised her to ice the wrist, elevate it, and avoid any excessive motion of the arm. As long as The hematoma stops accumulating, this should resolve without incident. If this does not start to just simply get better, she will followup with Dr. Wess Botts early next week. Ibuprofen or Tylenol as needed for pain.   Liam Graham, PA-C 06/22/13 1231

## 2013-06-23 NOTE — ED Provider Notes (Signed)
Medical screening examination/treatment/procedure(s) were performed by non-physician practitioner and as supervising physician I was immediately available for consultation/collaboration.  Philipp Deputy, M.D.  Harden Mo, MD 06/23/13 [phone]

## 2013-10-31 ENCOUNTER — Other Ambulatory Visit: Payer: Self-pay | Admitting: Family Medicine

## 2013-10-31 DIAGNOSIS — Z1231 Encounter for screening mammogram for malignant neoplasm of breast: Secondary | ICD-10-CM

## 2013-10-31 DIAGNOSIS — E559 Vitamin D deficiency, unspecified: Secondary | ICD-10-CM

## 2013-11-20 ENCOUNTER — Other Ambulatory Visit: Payer: Commercial Managed Care - PPO

## 2013-11-20 ENCOUNTER — Ambulatory Visit: Payer: Commercial Managed Care - PPO

## 2013-11-26 ENCOUNTER — Ambulatory Visit
Admission: RE | Admit: 2013-11-26 | Discharge: 2013-11-26 | Disposition: A | Discharge status: Home / Self Care | Payer: Commercial Managed Care - PPO | Source: Ambulatory Visit | Attending: Family Medicine | Admitting: Family Medicine

## 2013-11-26 DIAGNOSIS — Z1231 Encounter for screening mammogram for malignant neoplasm of breast: Secondary | ICD-10-CM

## 2013-11-26 DIAGNOSIS — E559 Vitamin D deficiency, unspecified: Secondary | ICD-10-CM

## 2014-03-03 ENCOUNTER — Encounter (HOSPITAL_COMMUNITY): Payer: Self-pay | Admitting: Emergency Medicine

## 2015-03-02 ENCOUNTER — Other Ambulatory Visit: Payer: Self-pay

## 2015-03-02 DIAGNOSIS — Z1231 Encounter for screening mammogram for malignant neoplasm of breast: Secondary | ICD-10-CM

## 2015-03-16 ENCOUNTER — Ambulatory Visit
Admission: RE | Admit: 2015-03-16 | Discharge: 2015-03-16 | Disposition: A | Discharge status: Home / Self Care | Payer: Commercial Managed Care - PPO | Source: Ambulatory Visit

## 2015-03-16 DIAGNOSIS — Z1231 Encounter for screening mammogram for malignant neoplasm of breast: Secondary | ICD-10-CM

## 2016-03-22 ENCOUNTER — Other Ambulatory Visit: Payer: Self-pay | Admitting: Family Medicine

## 2016-03-22 DIAGNOSIS — Z1231 Encounter for screening mammogram for malignant neoplasm of breast: Secondary | ICD-10-CM

## 2016-04-12 ENCOUNTER — Ambulatory Visit
Admission: RE | Admit: 2016-04-12 | Discharge: 2016-04-12 | Disposition: A | Discharge status: Home / Self Care | Payer: Commercial Managed Care - PPO | Source: Ambulatory Visit | Attending: Family Medicine | Admitting: Family Medicine

## 2016-04-12 DIAGNOSIS — Z1231 Encounter for screening mammogram for malignant neoplasm of breast: Secondary | ICD-10-CM

## 2017-06-16 DIAGNOSIS — J019 Acute sinusitis, unspecified: Secondary | ICD-10-CM | POA: Diagnosis not present

## 2017-07-18 DIAGNOSIS — H10022 Other mucopurulent conjunctivitis, left eye: Secondary | ICD-10-CM | POA: Diagnosis not present

## 2017-07-23 ENCOUNTER — Encounter (HOSPITAL_COMMUNITY): Payer: Self-pay | Admitting: Emergency Medicine

## 2017-07-23 ENCOUNTER — Ambulatory Visit (HOSPITAL_COMMUNITY)
Admission: EM | Admit: 2017-07-23 | Discharge: 2017-07-23 | Disposition: A | Discharge status: Home / Self Care | Payer: Commercial Managed Care - PPO | Attending: Internal Medicine | Admitting: Internal Medicine

## 2017-07-23 DIAGNOSIS — J209 Acute bronchitis, unspecified: Secondary | ICD-10-CM | POA: Diagnosis not present

## 2017-07-23 MED ORDER — ALBUTEROL SULFATE HFA 108 (90 BASE) MCG/ACT IN AERS: 1.0000 | INHALATION_SPRAY | Freq: Four times a day (QID) | RESPIRATORY_TRACT | 0 refills | Status: DC | PRN

## 2017-07-23 MED ORDER — IPRATROPIUM-ALBUTEROL 0.5-2.5 (3) MG/3ML IN SOLN
3.0000 mL | Freq: Once | RESPIRATORY_TRACT | Status: AC
  Administered 2017-07-23: 3 mL via RESPIRATORY_TRACT

## 2017-07-23 MED ORDER — IPRATROPIUM-ALBUTEROL 0.5-2.5 (3) MG/3ML IN SOLN
RESPIRATORY_TRACT | Status: AC
  Filled 2017-07-23: qty 3

## 2017-07-23 MED ORDER — PREDNISONE 20 MG PO TABS: 40.0000 mg | ORAL_TABLET | Freq: Every day | ORAL | 0 refills | Status: AC

## 2017-07-23 NOTE — ED Provider Notes (Signed)
[place]    CSN: [id number] Arrival date & time: 07/23/17  1202     History   Chief Complaint Chief Complaint  Patient presents with  . Cough    HPI Rebecca Summers is a 57 y.o. female.   Rebecca Summers presents with complaints of cough which is worsening over the past 5 days. She states that it is dry, typically non productive. Feels wheezy at times. With minimal nasal congestion. Mild right ear pain. Is on treatment for pink eye which she started 3/19 and has mildly improved. States has a history of bronchitis in the past. No known asthma history. Does not smoke. Parents smoke growing up. Fever approximately 1.5 weeks ago prior to symptoms starting, has not since had any fevers. Some diarrhea but otherwise eating and drinking without vomiting, abdominal pain or nausea. Hx of depression, syncope.    ROS per HPI.      Past Medical History:  Diagnosis Date  . Abnormal Pap smear 08/25/09   rare atypical glandular cells  . Depression   . Hypotension   . Vagal reaction    hx    Patient Active Problem List   Diagnosis Date Noted  . DEPRESSION 08/05/2009  . SYNCOPE 08/05/2009    Past Surgical History:  Procedure Laterality Date  . CARPAL TUNNEL RELEASE  2005  . CARPAL TUNNEL RELEASE Left 05/07/2013   Procedure: LEFT CARPAL TUNNEL RELEASE;  Surgeon: Cammie Sickle., MD;  Location: [facility];  Service: Orthopedics;  Laterality: Left;  . TONSILLECTOMY AND ADENOIDECTOMY      OB History    Gravida  1   Para  1   Term  1   Preterm      AB      Living  1     SAB      TAB      Ectopic      Multiple      Live Births               Home Medications    Prior to Admission medications   Medication Sig Start Date End Date Taking? Authorizing Provider  escitalopram (LEXAPRO) 10 MG tablet Take 10 mg by mouth daily.   Yes [provider]  albuterol (PROVENTIL HFA;VENTOLIN HFA) 108 (90 Base) MCG/ACT inhaler Inhale 1-2 puffs  into the lungs every 6 (six) hours as needed for wheezing or shortness of breath. 07/23/17   Augusto Gamble B, NP  chlordiazePOXIDE (LIBRIUM) 5 MG capsule Take 5 mg by mouth every evening.    [provider]  mometasone (NASONEX) 50 MCG/ACT nasal spray Place 2 sprays into the nose daily.    [provider]  predniSONE (DELTASONE) 20 MG tablet Take 2 tablets (40 mg total) by mouth daily with breakfast for 5 days. 07/23/17 07/28/17  Zigmund Gottron, NP    Family History Family History  Problem Relation Age of Onset  . Hypertension Mother   . Heart disease Mother   . Depression Father   . Anxiety disorder Father   . Depression Sister   . Anxiety disorder Sister   . Depression Brother   . Anxiety disorder Brother     Social History Social History   Tobacco Use  . Smoking status: Never Smoker  Substance Use Topics  . Alcohol use: No  . Drug use: No     Allergies   Patient has no known allergies.   Review of Systems Review of Systems  Physical Exam Triage Vital Signs ED Triage Vitals [07/23/17 1225]  Enc Vitals Group     BP 114/64     Pulse Rate 74     Resp 16     Temp 98.3 F (36.8 C)     Temp Source Oral     SpO2 98 %     Weight      Height      Head Circumference      Peak Flow      Pain Score      Pain Loc      Pain Edu?      Excl. in [place]?    No data found.  Updated Vital Signs BP 114/64 (BP Location: Right Arm)   Pulse 74   Temp 98.3 F (36.8 C) (Oral)   Resp 16   SpO2 98%   Visual Acuity Right Eye Distance:   Left Eye Distance:   Bilateral Distance:    Right Eye Near:   Left Eye Near:    Bilateral Near:     Physical Exam  Constitutional: She is oriented to person, place, and time. She appears well-developed and well-nourished. No distress.  HENT:  Head: Normocephalic and atraumatic.  Right Ear: Tympanic membrane, external ear and ear canal normal.  Left Ear: Tympanic membrane, external ear and ear canal normal.  Nose:  Nose normal.  Mouth/Throat: Uvula is midline, oropharynx is clear and moist and mucous membranes are normal. No tonsillar exudate.  Eyes: Pupils are equal, round, and reactive to light. EOM are normal. Right conjunctiva is injected. Left conjunctiva is injected.  Still with mild injection to bilateral eyes noted  Cardiovascular: Normal rate, regular rhythm and normal heart sounds.  Pulmonary/Chest: Effort normal and breath sounds normal.  Occasional strong dry cough noted; lungs clear   Neurological: She is alert and oriented to person, place, and time.  Skin: Skin is warm and dry.     UC Treatments / Results  Labs (all labs ordered are listed, but only abnormal results are displayed) Labs Reviewed - No data to display  EKG None Radiology No results found.  Procedures Procedures (including critical care time)  Medications Ordered in UC Medications  ipratropium-albuterol (DUONEB) 0.5-2.5 (3) MG/3ML nebulizer solution 3 mL (has no administration in time range)     Initial Impression / Assessment and Plan / UC Course  I have reviewed the triage vital signs and the nursing notes.  Pertinent labs & imaging results that were available during my care of the patient were reviewed by me and considered in my medical decision making (see chart for details).     Question allergic symptoms as eyes still with some redness noted despite being on treatment for 5 days. Zyrtec daily. Prednisone, albuterol. Breathing treatment provided prior to departure per patient request. Continue with supportive cares. Return precautions provided. Patient verbalized understanding and agreeable to plan.    Final Clinical Impressions(s) / UC Diagnoses   Final diagnoses:  Acute bronchitis, unspecified organism    ED Discharge Orders        Ordered    predniSONE (DELTASONE) 20 MG tablet  Daily with breakfast     07/23/17 1242    albuterol (PROVENTIL HFA;VENTOLIN HFA) 108 (90 Base) MCG/ACT inhaler   Every 6 hours PRN     07/23/17 1242       Controlled Substance Prescriptions [place] Controlled Substance Registry consulted? Not Applicable   Zigmund Gottron, NP 07/23/17 1250

## 2017-07-23 NOTE — ED Triage Notes (Signed)
Pt c/o pink eye since last Tuesday, pt also c/o sinus issues x6 weeks. Pt had low fever a week ago. Pt c/o pressure in her face, congestion, has been taking mucinex. Pt states she has this really bad cough.

## 2017-07-23 NOTE — Discharge Instructions (Signed)
Push fluids to ensure adequate hydration and keep secretions thin.  Tylenol and/or ibuprofen as needed for pain or fevers.  5 days of prednisone. Use of inhaler every 6 hours as needed for wheezing or shortness of breath.  Continue with daily flonase. Complete course of eye drops. May add zyrtec daily as well which may further help with symptoms.  If symptoms worsen or do not improve in the next week to return to be seen or to follow up with your PCP.

## 2017-08-03 DIAGNOSIS — J069 Acute upper respiratory infection, unspecified: Secondary | ICD-10-CM | POA: Diagnosis not present

## 2017-08-03 DIAGNOSIS — B9789 Other viral agents as the cause of diseases classified elsewhere: Secondary | ICD-10-CM | POA: Diagnosis not present

## 2017-08-03 DIAGNOSIS — H109 Unspecified conjunctivitis: Secondary | ICD-10-CM | POA: Diagnosis not present

## 2017-08-10 DIAGNOSIS — H1032 Unspecified acute conjunctivitis, left eye: Secondary | ICD-10-CM | POA: Diagnosis not present

## 2017-08-10 DIAGNOSIS — R05 Cough: Secondary | ICD-10-CM | POA: Diagnosis not present

## 2017-08-11 ENCOUNTER — Ambulatory Visit
Admission: RE | Admit: 2017-08-11 | Discharge: 2017-08-11 | Disposition: A | Discharge status: Home / Self Care | Payer: Commercial Managed Care - PPO | Source: Ambulatory Visit | Attending: Family Medicine | Admitting: Family Medicine

## 2017-08-11 ENCOUNTER — Other Ambulatory Visit: Payer: Self-pay | Admitting: Family Medicine

## 2017-08-11 DIAGNOSIS — R059 Cough, unspecified: Secondary | ICD-10-CM

## 2017-08-11 DIAGNOSIS — R05 Cough: Secondary | ICD-10-CM

## 2017-11-22 ENCOUNTER — Other Ambulatory Visit: Payer: Self-pay | Admitting: Family Medicine

## 2017-11-22 DIAGNOSIS — Z1231 Encounter for screening mammogram for malignant neoplasm of breast: Secondary | ICD-10-CM

## 2017-12-15 ENCOUNTER — Ambulatory Visit
Admission: RE | Admit: 2017-12-15 | Discharge: 2017-12-15 | Disposition: A | Discharge status: Home / Self Care | Payer: Commercial Managed Care - PPO | Source: Ambulatory Visit | Attending: Family Medicine | Admitting: Family Medicine

## 2017-12-15 DIAGNOSIS — Z1231 Encounter for screening mammogram for malignant neoplasm of breast: Secondary | ICD-10-CM

## 2018-05-29 DIAGNOSIS — J0100 Acute maxillary sinusitis, unspecified: Secondary | ICD-10-CM | POA: Diagnosis not present

## 2018-07-10 DIAGNOSIS — J209 Acute bronchitis, unspecified: Secondary | ICD-10-CM | POA: Diagnosis not present

## 2018-07-13 DIAGNOSIS — S161XXA Strain of muscle, fascia and tendon at neck level, initial encounter: Secondary | ICD-10-CM | POA: Diagnosis not present

## 2018-10-12 ENCOUNTER — Other Ambulatory Visit: Payer: Commercial Managed Care - PPO

## 2018-10-12 ENCOUNTER — Telehealth: Payer: Self-pay | Admitting: Hematology

## 2018-10-12 DIAGNOSIS — Z20822 Contact with and (suspected) exposure to covid-19: Secondary | ICD-10-CM

## 2018-10-12 NOTE — Telephone Encounter (Signed)
Referred for COVID testing by Silverio Decamp, NP from occupational health / Patient states she could get to the testing center by 3:45.

## 2018-10-12 NOTE — Telephone Encounter (Signed)
Opened in error

## 2018-10-14 LAB — NOVEL CORONAVIRUS, NAA: SARS-CoV-2, NAA: NOT DETECTED

## 2018-11-24 ENCOUNTER — Ambulatory Visit (HOSPITAL_COMMUNITY)
Admission: EM | Admit: 2018-11-24 | Discharge: 2018-11-24 | Disposition: A | Discharge status: Home / Self Care | Payer: Commercial Managed Care - PPO | Attending: Emergency Medicine | Admitting: Emergency Medicine

## 2018-11-24 ENCOUNTER — Encounter (HOSPITAL_COMMUNITY): Payer: Self-pay | Admitting: *Deleted

## 2018-11-24 ENCOUNTER — Other Ambulatory Visit: Payer: Self-pay

## 2018-11-24 DIAGNOSIS — R21 Rash and other nonspecific skin eruption: Secondary | ICD-10-CM | POA: Diagnosis not present

## 2018-11-24 MED ORDER — CLOTRIMAZOLE-BETAMETHASONE 1-0.05 % EX CREA: TOPICAL_CREAM | CUTANEOUS | 0 refills | Status: DC

## 2018-11-24 NOTE — ED Provider Notes (Signed)
[facility]    CSN: [id number] Arrival date & time: 11/24/18  1105      History   Chief Complaint Chief Complaint  Patient presents with  . Appointment    1130  . Rash    HPI Rebecca Summers is a 58 y.o. female.   Rebecca Summers presents with complaints of rash to right lower leg which has had now for approximately 2 months. She thought initially it was poison oak and was treating supportively with topical calamine combination cream. It did help and rash improved for some time. Over the past two weeks it has returned and seems larger than it had. No pain. Itches. Denies any previous similar. No other known allergen exposure. No drainage. Non tender. Hx of depression, syncope.     ROS per HPI, negative if not otherwise mentioned.      Past Medical History:  Diagnosis Date  . Abnormal Pap smear 08/25/09   rare atypical glandular cells  . Depression   . Hypotension   . Vagal reaction    hx    Patient Active Problem List   Diagnosis Date Noted  . DEPRESSION 08/05/2009  . SYNCOPE 08/05/2009    Past Surgical History:  Procedure Laterality Date  . CARPAL TUNNEL RELEASE  2005  . CARPAL TUNNEL RELEASE Left 05/07/2013   Procedure: LEFT CARPAL TUNNEL RELEASE;  Surgeon: Cammie Sickle., MD;  Location: [facility];  Service: Orthopedics;  Laterality: Left;  . TONSILLECTOMY AND ADENOIDECTOMY      OB History    Gravida  1   Para  1   Term  1   Preterm      AB      Living  1     SAB      TAB      Ectopic      Multiple      Live Births               Home Medications    Prior to Admission medications   Medication Sig Start Date End Date Taking? Authorizing Provider  chlordiazePOXIDE (LIBRIUM) 5 MG capsule Take 5 mg by mouth every evening.   Yes [provider]  escitalopram (LEXAPRO) 10 MG tablet Take 10 mg by mouth daily.   Yes [provider]  albuterol (PROVENTIL HFA;VENTOLIN HFA) 108 (90 Base)  MCG/ACT inhaler Inhale 1-2 puffs into the lungs every 6 (six) hours as needed for wheezing or shortness of breath. 07/23/17   Zigmund Gottron, NP  clotrimazole-betamethasone (LOTRISONE) cream Apply to affected area 2 times daily prn 11/24/18   Augusto Gamble B, NP  mometasone (NASONEX) 50 MCG/ACT nasal spray Place 2 sprays into the nose daily.    [provider]    Family History Family History  Problem Relation Age of Onset  . Hypertension Mother   . Heart disease Mother   . Depression Father   . Anxiety disorder Father   . Depression Sister   . Anxiety disorder Sister   . Depression Brother   . Anxiety disorder Brother     Social History Social History   Tobacco Use  . Smoking status: Never Smoker  . Smokeless tobacco: Never Used  Substance Use Topics  . Alcohol use: No  . Drug use: No     Allergies   Patient has no known allergies.   Review of Systems Review of Systems   Physical Exam Triage Vital Signs ED Triage Vitals [11/24/18 1121]  Enc Vitals Group     BP 111/60     Pulse Rate 74     Resp 16     Temp 98.1 F (36.7 C)     Temp Source Oral     SpO2 97 %     Weight      Height      Head Circumference      Peak Flow      Pain Score 0     Pain Loc      Pain Edu?      Excl. in [place]?    No data found.  Updated Vital Signs BP 111/60   Pulse 74   Temp 98.1 F (36.7 C) (Oral)   Resp 16   SpO2 97%   Visual Acuity Right Eye Distance:   Left Eye Distance:   Bilateral Distance:    Right Eye Near:   Left Eye Near:    Bilateral Near:     Physical Exam Constitutional:      General: She is not in acute distress.    Appearance: She is well-developed.  Cardiovascular:     Rate and Rhythm: Normal rate.  Pulmonary:     Effort: Pulmonary effort is normal.  Skin:    General: Skin is warm and dry.     Findings: Rash present.          Comments: Raised papular lesion in circular pattern with central clearing; no drainage or fluctuance; non  tender; see photos   Neurological:     Mental Status: She is alert and oriented to person, place, and time.          UC Treatments / Results  Labs (all labs ordered are listed, but only abnormal results are displayed) Labs Reviewed - No data to display  EKG   Radiology No results found.  Procedures Procedures (including critical care time)  Medications Ordered in UC Medications - No data to display  Initial Impression / Assessment and Plan / UC Course  I have reviewed the triage vital signs and the nursing notes.  Pertinent labs & imaging results that were available during my care of the patient were reviewed by me and considered in my medical decision making (see chart for details).     Dermatitis vs fungal rash, lotrisone provided. Continue with calamine as needed for comfort. Patient verbalized understanding and agreeable to plan.   Final Clinical Impressions(s) / UC Diagnoses   Final diagnoses:  Rash     Discharge Instructions     We will treat with a topical steroid/antifungal combination which would cover for a dermatitis as well as fungal infection.  If symptoms worsen or do not improve in the next 2 weeks to return to be seen or to follow up with your PCP.     ED Prescriptions    Medication Sig Dispense Auth. Provider   clotrimazole-betamethasone (LOTRISONE) cream Apply to affected area 2 times daily prn 15 g Augusto Gamble B, NP     Controlled Substance Prescriptions [place] Controlled Substance Registry consulted? Not Applicable   Zigmund Gottron, NP 11/24/18 1249

## 2018-11-24 NOTE — ED Triage Notes (Signed)
Reports raised, pruritic, red rash to right posterior lower leg x approx 6 wks.  States was treating as poison oak with some improvement, but now pruritis worsening.

## 2018-11-24 NOTE — Discharge Instructions (Addendum)
We will treat with a topical steroid/antifungal combination which would cover for a dermatitis as well as fungal infection.  If symptoms worsen or do not improve in the next 2 weeks to return to be seen or to follow up with your PCP.

## 2018-12-24 ENCOUNTER — Other Ambulatory Visit: Payer: Self-pay | Admitting: Family Medicine

## 2018-12-24 ENCOUNTER — Other Ambulatory Visit: Payer: Self-pay | Admitting: Internal Medicine

## 2018-12-24 DIAGNOSIS — Z1231 Encounter for screening mammogram for malignant neoplasm of breast: Secondary | ICD-10-CM

## 2019-02-06 ENCOUNTER — Other Ambulatory Visit: Payer: Self-pay

## 2019-02-06 ENCOUNTER — Ambulatory Visit
Admission: RE | Admit: 2019-02-06 | Discharge: 2019-02-06 | Disposition: A | Discharge status: Home / Self Care | Payer: Commercial Managed Care - PPO | Source: Ambulatory Visit | Attending: Internal Medicine | Admitting: Internal Medicine

## 2019-02-06 DIAGNOSIS — Z1231 Encounter for screening mammogram for malignant neoplasm of breast: Secondary | ICD-10-CM

## 2019-12-17 ENCOUNTER — Other Ambulatory Visit: Payer: Self-pay | Admitting: Family Medicine

## 2019-12-17 DIAGNOSIS — N950 Postmenopausal bleeding: Secondary | ICD-10-CM

## 2019-12-19 ENCOUNTER — Ambulatory Visit
Admission: RE | Admit: 2019-12-19 | Discharge: 2019-12-19 | Disposition: A | Discharge status: Home / Self Care | Payer: Commercial Managed Care - PPO | Source: Ambulatory Visit | Attending: Family Medicine | Admitting: Family Medicine

## 2019-12-19 DIAGNOSIS — N950 Postmenopausal bleeding: Secondary | ICD-10-CM

## 2020-02-04 ENCOUNTER — Encounter: Payer: Self-pay | Admitting: Obstetrics & Gynecology

## 2020-02-04 ENCOUNTER — Ambulatory Visit: Payer: Commercial Managed Care - PPO | Admitting: Obstetrics & Gynecology

## 2020-02-04 ENCOUNTER — Other Ambulatory Visit: Payer: Self-pay

## 2020-02-04 ENCOUNTER — Other Ambulatory Visit (HOSPITAL_COMMUNITY)
Admission: RE | Admit: 2020-02-04 | Discharge: 2020-02-04 | Disposition: A | Discharge status: Home / Self Care | Payer: Commercial Managed Care - PPO | Source: Ambulatory Visit | Attending: Obstetrics & Gynecology | Admitting: Obstetrics & Gynecology

## 2020-02-04 VITALS — BP 114/70 | HR 68 | Resp 16 | Ht 63.75 in | Wt 176.0 lb

## 2020-02-04 DIAGNOSIS — Z124 Encounter for screening for malignant neoplasm of cervix: Secondary | ICD-10-CM | POA: Diagnosis present

## 2020-02-04 DIAGNOSIS — Z01419 Encounter for gynecological examination (general) (routine) without abnormal findings: Secondary | ICD-10-CM | POA: Diagnosis not present

## 2020-02-04 NOTE — Patient Instructions (Signed)
[facility] [place] [place],  [place]  [address] Get Driving Directions Main: [phone]  Fax: [phone]

## 2020-02-04 NOTE — Progress Notes (Signed)
[name] here for annual exam & post menopausal bleeding a couple months ago that lasted for 6 hrs.  She is a former patient but hasn't been seen by gynecology in at least 10 years.  She reports she is divorced and is sexually active.  Wondered if bleeding was from intercourse.  Has ultrasound in august.  Endometrium was 2.29mm.  Lower uterine segment fibroid noted.  Left ovary small.  Right now visualized.  Images reviewed with pt.  Would like some resources for weight loss.   No LMP recorded. Patient is postmenopausal.          Sexually active: Yes.    The current method of family planning is post menopausal status.    Exercising: No.  exercise Smoker:  no  Health Maintenance: Pap:  unsure History of abnormal Pap:  yes MMG:  02-06-2019 category b density birads 1:neg Colonoscopy:  Age 81 or 32. Due next year per patient BMD:   none TDaP:  UTD Pneumonia vaccine(s):  Not done Shingrix:   Would like to get this Hep C testing: not done Screening Labs: done with Dr. Nancy Summers   reports that she has never smoked. She has never used smokeless tobacco. She reports that she does not drink alcohol and does not use drugs.  Past Medical History:  Diagnosis Date  . Abnormal Pap smear 08/25/09   rare atypical glandular cells  . Depression   . Hypotension   . Vagal reaction    hx    Past Surgical History:  Procedure Laterality Date  . CARPAL TUNNEL RELEASE Right 2005  . CARPAL TUNNEL RELEASE Left 05/07/2013   Procedure: LEFT CARPAL TUNNEL RELEASE;  Surgeon: Rebecca Summers., MD;  Location: [facility];  Service: Orthopedics;  Laterality: Left;  . TONSILLECTOMY AND ADENOIDECTOMY      Current Outpatient Medications  Medication Sig Dispense Refill  . chlordiazePOXIDE (LIBRIUM) 5 MG capsule Take 5 mg by mouth every evening.    . escitalopram (LEXAPRO) 20 MG tablet Take 20 mg by mouth daily.     No current facility-administered medications  for this visit.    Family History  Problem Relation Age of Onset  . Hypertension Mother   . Depression Father   . Anxiety disorder Father   . Depression Sister   . Anxiety disorder Sister   . Hypertension Sister   . Depression Brother   . Anxiety disorder Brother   . Hypertension Brother   . Diabetes Maternal Grandmother   . Stroke Paternal Grandfather     Review of Systems  Constitutional: Negative.   HENT: Negative.   Eyes: Negative.   Respiratory: Negative.   Cardiovascular: Negative.   Gastrointestinal: Negative.   Endocrine: Negative.   Genitourinary:       Post menopausal bleeding  Musculoskeletal: Negative.   Skin: Negative.   Allergic/Immunologic: Negative.   Neurological: Negative.   Hematological: Negative.   Psychiatric/Behavioral: Negative.     Exam:   BP 114/70   Pulse 68   Resp 16   Ht 5' 3.75" (1.619 m)   Wt 176 lb (79.8 kg)   BMI 30.45 kg/m   Height: 5' 3.75" (161.9 cm)  General appearance: alert, cooperative and appears stated age Head: Normocephalic, without obvious abnormality, atraumatic Neck: no adenopathy, supple, symmetrical, trachea midline and thyroid normal to inspection and palpation Lungs: clear to auscultation bilaterally Breasts: normal appearance, no masses or tenderness Heart: regular rate and rhythm Abdomen:  soft, non-tender; bowel sounds normal; no masses,  no organomegaly Extremities: extremities normal, atraumatic, no cyanosis or edema Skin: Skin color, texture, turgor normal. No rashes or lesions Lymph nodes: Cervical, supraclavicular, and axillary nodes normal. No abnormal inguinal nodes palpated Neurologic: Grossly normal   Pelvic: External genitalia:  no lesions              Urethra:  normal appearing urethra with no masses, tenderness or lesions              Bartholins and Skenes: normal                 Vagina: normal appearing vagina with normal color and discharge, no lesions              Cervix: no lesions               Pap taken: Yes.   Bimanual Exam:  Uterus:  normal size, contour, position, consistency, mobility, non-tender              Adnexa: normal adnexa and no mass, fullness, tenderness               Rectovaginal: Confirms               Anus:  normal sphincter tone, no lesions  Chaperone, Rebecca Summers, CMA, was present for exam.  A:  Well Woman with normal exam PMP, no HRT H/o single episode of PMP bleeding, 2.26mm endometrial thickness on ultrasound BMI >30  P:   Mammogram guidelines reviewed.  Doing 3D MMG. pap smear with HR HPV obtained today Pt knows to call if has any other vaginal bleeding Colonoscopy due next year Lab work done with Dr. Nancy Summers return annually or prn

## 2020-02-05 LAB — CYTOLOGY - PAP
Comment: NEGATIVE
Diagnosis: NEGATIVE
High risk HPV: NEGATIVE

## 2020-02-20 ENCOUNTER — Ambulatory Visit (INDEPENDENT_AMBULATORY_CARE_PROVIDER_SITE_OTHER): Payer: Commercial Managed Care - PPO | Admitting: Family Medicine

## 2020-03-02 ENCOUNTER — Ambulatory Visit (INDEPENDENT_AMBULATORY_CARE_PROVIDER_SITE_OTHER): Payer: Commercial Managed Care - PPO | Admitting: Family Medicine

## 2020-03-02 ENCOUNTER — Other Ambulatory Visit: Payer: Self-pay

## 2020-03-02 ENCOUNTER — Encounter (INDEPENDENT_AMBULATORY_CARE_PROVIDER_SITE_OTHER): Payer: Self-pay

## 2020-03-02 VITALS — BP 126/65 | HR 74 | Temp 98.1°F | Ht 64.0 in | Wt 173.0 lb

## 2020-03-05 ENCOUNTER — Ambulatory Visit (INDEPENDENT_AMBULATORY_CARE_PROVIDER_SITE_OTHER): Payer: Commercial Managed Care - PPO | Admitting: Family Medicine

## 2020-03-09 ENCOUNTER — Encounter (INDEPENDENT_AMBULATORY_CARE_PROVIDER_SITE_OTHER): Payer: Self-pay | Admitting: Family Medicine

## 2020-03-09 ENCOUNTER — Other Ambulatory Visit: Payer: Self-pay

## 2020-03-09 ENCOUNTER — Ambulatory Visit (INDEPENDENT_AMBULATORY_CARE_PROVIDER_SITE_OTHER): Payer: Commercial Managed Care - PPO | Admitting: Family Medicine

## 2020-03-09 VITALS — BP 120/72 | HR 74 | Temp 97.8°F | Ht 64.0 in | Wt 177.0 lb

## 2020-03-09 DIAGNOSIS — R0602 Shortness of breath: Secondary | ICD-10-CM | POA: Diagnosis not present

## 2020-03-09 DIAGNOSIS — Z9189 Other specified personal risk factors, not elsewhere classified: Secondary | ICD-10-CM

## 2020-03-09 DIAGNOSIS — E7849 Other hyperlipidemia: Secondary | ICD-10-CM

## 2020-03-09 DIAGNOSIS — E669 Obesity, unspecified: Secondary | ICD-10-CM

## 2020-03-09 DIAGNOSIS — Z6830 Body mass index (BMI) 30.0-30.9, adult: Secondary | ICD-10-CM

## 2020-03-09 DIAGNOSIS — Z1331 Encounter for screening for depression: Secondary | ICD-10-CM

## 2020-03-09 DIAGNOSIS — F419 Anxiety disorder, unspecified: Secondary | ICD-10-CM | POA: Diagnosis not present

## 2020-03-09 DIAGNOSIS — R5383 Other fatigue: Secondary | ICD-10-CM | POA: Diagnosis not present

## 2020-03-09 DIAGNOSIS — Z0289 Encounter for other administrative examinations: Secondary | ICD-10-CM

## 2020-03-16 ENCOUNTER — Ambulatory Visit (INDEPENDENT_AMBULATORY_CARE_PROVIDER_SITE_OTHER): Payer: Commercial Managed Care - PPO | Admitting: Family Medicine

## 2020-03-16 NOTE — Progress Notes (Signed)
Dear Donald Prose, MD,   Thank you for referring Arzu Mcgaughey to our clinic. The following note includes my evaluation and treatment recommendations.  Chief Complaint:   OBESITY Rebecca Summers (MR# [id number]) is a 59 y.o. female who presents for evaluation and treatment of obesity and related comorbidities. Current BMI is Body mass index is 30.38 kg/m. Kaysea has been struggling with her weight for many years and has been unsuccessful in either losing weight, maintaining weight loss, or reaching her healthy weight goal.  Giada is currently in the action stage of change and ready to dedicate time achieving and maintaining a healthier weight. Lesette is interested in becoming our patient and working on intensive lifestyle modifications including (but not limited to) diet and exercise for weight loss.  Arlina's habits were reviewed today and are as follows: Her family eats meals together, she thinks her family will eat healthier with her, her desired weight loss is 32 lbs, she has been heavy most of her life, she started gaining weight 30 years after childbirth, never lost it, her heaviest weight ever was 177 pounds, she has significant food cravings issues, she is frequently drinking liquids with calories, she frequently makes poor food choices, she frequently eats larger portions than normal and she struggles with emotional eating.  Depression Screen Fusae's Food and Mood (modified PHQ-9) score was 9.  Depression screen PHQ 2/9 03/09/2020  Decreased Interest 1  Down, Depressed, Hopeless 1  PHQ - 2 Score 2  Altered sleeping 1  Tired, decreased energy 2  Change in appetite 1  Feeling bad or failure about yourself  1  Trouble concentrating 2  Moving slowly or fidgety/restless 0  Suicidal thoughts 0  PHQ-9 Score 9  Difficult doing work/chores Not difficult at all   Subjective:   1. Other fatigue Priscella admits to daytime somnolence and admits to waking up still tired. Patent has a history  of symptoms of daytime fatigue. Toriann generally gets 6 or 7 hours of sleep per night, and states that she has generally restful sleep. Snoring is not present. Apneic episodes are not present. Epworth Sleepiness Score is 3. EKG-normal sinus rhythm at 69 BPM.  2. SOB (shortness of breath) on exertion E[place]ria notes increasing shortness of breath with exercising and seems to be worsening over time with weight gain. She notes getting out of breath sooner with activity than she used to. This has not gotten worse recently. Annalynn denies shortness of breath at rest or orthopnea.  3. Other hyperlipidemia Tomekia was diagnosed 3 years ago and she is not on medicine.  4. Anxiety Jeanet is on Lexapro 20 mg and she denies suicidal or homicidal ideas. Her symptoms are well managed.  5. At risk for heart disease Erandy is at a higher than average risk for cardiovascular disease due to obesity.   Assessment/Plan:   1. Other fatigue Karne does feel that her weight is causing her energy to be lower than it should be. Fatigue may be related to obesity, depression or many other causes. Labs will be ordered, and in the meanwhile, Otis will focus on self care including making healthy food choices, increasing physical activity and focusing on stress reduction.  - EKG 12-Lead - CBC with Differential/Platelet - Hemoglobin A1c - Insulin, random - VITAMIN D 25 Hydroxy (Vit-D Deficiency, Fractures) - Vitamin B12 - Folate - T3 - T4 - TSH  2. SOB (shortness of breath) on exertion Denyse does feel that she gets out of breath more  easily that she used to when she exercises. Kadie's shortness of breath appears to be obesity related and exercise induced. She has agreed to work on weight loss and gradually increase exercise to treat her exercise induced shortness of breath. Will continue to monitor closely.  - CBC with Differential/Platelet - Hemoglobin A1c - Insulin, random - VITAMIN D 25 Hydroxy (Vit-D Deficiency,  Fractures) - Vitamin B12 - Folate - T3 - T4 - TSH  3. Other hyperlipidemia Cardiovascular risk and specific lipid/LDL goals reviewed. We discussed several lifestyle modifications today. We will follow up on labs in the next week. Mckaylee will continue to work on diet, exercise and weight loss efforts. Orders and follow up as documented in patient record.   Counseling Intensive lifestyle modifications are the first line treatment for this issue. . Dietary changes: Increase soluble fiber. Decrease simple carbohydrates. . Exercise changes: Moderate to vigorous-intensity aerobic activity 150 minutes per week if tolerated. . Lipid-lowering medications: see documented in medical record.  - Comprehensive metabolic panel - Lipid Panel With LDL/HDL Ratio  4. Anxiety Behavior modification techniques were discussed today to help Alixis deal with her anxiety. Adri will continue Lexapro. Orders and follow up as documented in patient record.   5. Depression screening Tylia had a positive depression screening. Depression is commonly associated with obesity and often results in emotional eating behaviors. We will monitor this closely and work on CBT to help improve the non-hunger eating patterns. Referral to Psychology may be required if no improvement is seen as she continues in our clinic.  6. At risk for heart disease Tiffine was given approximately 15 minutes of coronary artery disease prevention counseling today. She is 59 y.o. female and has risk factors for heart disease including obesity. We discussed intensive lifestyle modifications today with an emphasis on specific weight loss instructions and strategies.   Repetitive spaced learning was employed today to elicit superior memory formation and behavioral change.  7. Class 1 obesity with serious comorbidity and body mass index (BMI) of 30.0 to 30.9 in adult, unspecified obesity type Breionna is currently in the action stage of change and her goal is  to continue with weight loss efforts. I recommend Arthea begin the structured treatment plan as follows:  She has agreed to the [employer] + 300 calories.  Exercise goals: No exercise has been prescribed at this time.   Behavioral modification strategies: increasing lean protein intake, meal planning and cooking strategies and keeping healthy foods in the home.  She was informed of the importance of frequent follow-up visits to maximize her success with intensive lifestyle modifications for her multiple health conditions. She was informed we would discuss her lab results at her next visit unless there is a critical issue that needs to be addressed sooner. Hamsini agreed to keep her next visit at the agreed upon time to discuss these results.  Objective:   Blood pressure 120/72, pulse 74, temperature 97.8 F (36.6 C), temperature source Oral, height 5\' 4"  (1.626 m), weight 177 lb (80.3 kg), SpO2 97 %. Body mass index is 30.38 kg/m.  EKG: Normal sinus rhythm, rate 69 BPM.  Indirect Calorimeter completed today shows a VO2 of 260 and a REE of 1809.  Her calculated basal metabolic rate is 7026 thus her basal metabolic rate is better than expected.  General: Cooperative, alert, well developed, in no acute distress. HEENT: Conjunctivae and lids unremarkable. Cardiovascular: Regular rhythm.  Lungs: Normal work of breathing. Neurologic: No focal deficits.   No results  found for: CREATININE, BUN, NA, K, CL, CO2 No results found for: ALT, AST, GGT, ALKPHOS, BILITOT No results found for: HGBA1C No results found for: INSULIN No results found for: TSH No results found for: CHOL, HDL, LDLCALC, LDLDIRECT, TRIG, CHOLHDL Lab Results  Component Value Date   HGB 13.4 05/07/2013   No results found for: IRON, TIBC, FERRITIN  Attestation Statements:   Reviewed by clinician on day of visit: allergies, medications, problem list, medical history, surgical history, family history, social history, and  previous encounter notes.   I, Trixie Dredge, am acting as transcriptionist for Coralie Common, MD.  This is the patient's first visit at [facility]. The patient's NEW PATIENT PACKET was reviewed at length. Included in the packet: current and past health history, medications, allergies, ROS, gynecologic history (women only), surgical history, family history, social history, weight history, weight loss surgery history (for those that have had weight loss surgery), nutritional evaluation, mood and food questionnaire, PHQ9, Epworth questionnaire, sleep habits questionnaire, patient life and health improvement goals questionnaire. These will all be scanned into the patient's chart under media.   During the visit, I independently reviewed the patient's EKG, bioimpedance scale results, and indirect calorimeter results. I used this information to tailor a meal plan for the patient that will help her to lose weight and will improve her obesity-related conditions going forward. I performed a medically necessary appropriate examination and/or evaluation. I discussed the assessment and treatment plan with the patient. The patient was provided an opportunity to ask questions and all were answered. The patient agreed with the plan and demonstrated an understanding of the instructions. Labs were ordered at this visit and will be reviewed at the next visit unless more critical results need to be addressed immediately. Clinical information was updated and documented in the EMR.   Time spent on visit including pre-visit chart review and post-visit care was 50 minutes.   A separate 15 minutes was spent on risk counseling (see above).  I have reviewed the above documentation for accuracy and completeness, and I agree with the above. - Jinny Blossom, MD

## 2020-03-17 LAB — INSULIN, RANDOM: INSULIN: 21.1 u[IU]/mL (ref 2.6–24.9)

## 2020-03-17 LAB — CBC WITH DIFFERENTIAL/PLATELET
Basophils Absolute: 0 10*3/uL (ref 0.0–0.2)
Basos: 1 %
EOS (ABSOLUTE): 0.1 10*3/uL (ref 0.0–0.4)
Eos: 2 %
Hematocrit: 38.4 % (ref 34.0–46.6)
Hemoglobin: 13.3 g/dL (ref 11.1–15.9)
Immature Grans (Abs): 0 10*3/uL (ref 0.0–0.1)
Immature Granulocytes: 0 %
Lymphocytes Absolute: 2.1 10*3/uL (ref 0.7–3.1)
Lymphs: 35 %
MCH: 31.3 pg (ref 26.6–33.0)
MCHC: 34.6 g/dL (ref 31.5–35.7)
MCV: 90 fL (ref 79–97)
Monocytes Absolute: 0.5 10*3/uL (ref 0.1–0.9)
Monocytes: 9 %
Neutrophils Absolute: 3.2 10*3/uL (ref 1.4–7.0)
Neutrophils: 53 %
Platelets: 289 10*3/uL (ref 150–450)
RBC: 4.25 x10E6/uL (ref 3.77–5.28)
RDW: 12.7 % (ref 11.7–15.4)
WBC: 6 10*3/uL (ref 3.4–10.8)

## 2020-03-17 LAB — T3: T3, Total: 116 ng/dL (ref 71–180)

## 2020-03-17 LAB — HEMOGLOBIN A1C
Est. average glucose Bld gHb Est-mCnc: 114 mg/dL
Hgb A1c MFr Bld: 5.6 % (ref 4.8–5.6)

## 2020-03-17 LAB — LIPID PANEL WITH LDL/HDL RATIO
Cholesterol, Total: 272 mg/dL — ABNORMAL HIGH (ref 100–199)
HDL: 43 mg/dL (ref >39)
LDL Chol Calc (NIH): 196 mg/dL — ABNORMAL HIGH (ref 0–99)
LDL/HDL Ratio: 4.6 ratio — ABNORMAL HIGH (ref 0.0–3.2)
Triglycerides: 174 mg/dL — ABNORMAL HIGH (ref 0–149)
VLDL Cholesterol Cal: 33 mg/dL (ref 5–40)

## 2020-03-17 LAB — COMPREHENSIVE METABOLIC PANEL
ALT: 17 IU/L (ref 0–32)
AST: 17 IU/L (ref 0–40)
Albumin/Globulin Ratio: 1.5 (ref 1.2–2.2)
Albumin: 4.4 g/dL (ref 3.8–4.9)
Alkaline Phosphatase: 71 IU/L (ref 44–121)
BUN/Creatinine Ratio: 18 (ref 9–23)
BUN: 15 mg/dL (ref 6–24)
Bilirubin Total: 0.4 mg/dL (ref 0.0–1.2)
CO2: 22 mmol/L (ref 20–29)
Calcium: 9.4 mg/dL (ref 8.7–10.2)
Chloride: 103 mmol/L (ref 96–106)
Creatinine, Ser: 0.83 mg/dL (ref 0.57–1.00)
GFR calc Af Amer: 89 mL/min/{1.73_m2} (ref >59)
GFR calc non Af Amer: 77 mL/min/{1.73_m2} (ref >59)
Globulin, Total: 2.9 g/dL (ref 1.5–4.5)
Glucose: 108 mg/dL — ABNORMAL HIGH (ref 65–99)
Potassium: 4.8 mmol/L (ref 3.5–5.2)
Sodium: 140 mmol/L (ref 134–144)
Total Protein: 7.3 g/dL (ref 6.0–8.5)

## 2020-03-17 LAB — VITAMIN D 25 HYDROXY (VIT D DEFICIENCY, FRACTURES): Vit D, 25-Hydroxy: 26.1 ng/mL — ABNORMAL LOW (ref 30.0–100.0)

## 2020-03-17 LAB — TSH: TSH: 3.72 u[IU]/mL (ref 0.450–4.500)

## 2020-03-17 LAB — VITAMIN B12: Vitamin B-12: 387 pg/mL (ref 232–1245)

## 2020-03-17 LAB — FOLATE: Folate: 11.8 ng/mL (ref >3.0)

## 2020-03-17 LAB — T4: T4, Total: 6.9 ug/dL (ref 4.5–12.0)

## 2020-03-18 ENCOUNTER — Other Ambulatory Visit: Payer: Self-pay | Admitting: Family Medicine

## 2020-03-18 DIAGNOSIS — Z1231 Encounter for screening mammogram for malignant neoplasm of breast: Secondary | ICD-10-CM

## 2020-03-23 ENCOUNTER — Ambulatory Visit (INDEPENDENT_AMBULATORY_CARE_PROVIDER_SITE_OTHER): Payer: Commercial Managed Care - PPO | Admitting: Family Medicine

## 2020-03-23 ENCOUNTER — Other Ambulatory Visit: Payer: Self-pay

## 2020-03-23 ENCOUNTER — Encounter (INDEPENDENT_AMBULATORY_CARE_PROVIDER_SITE_OTHER): Payer: Self-pay | Admitting: Family Medicine

## 2020-03-23 VITALS — BP 138/86 | HR 73 | Temp 98.2°F | Ht 64.0 in | Wt 171.0 lb

## 2020-03-23 DIAGNOSIS — E8881 Metabolic syndrome: Secondary | ICD-10-CM | POA: Diagnosis not present

## 2020-03-23 DIAGNOSIS — E7849 Other hyperlipidemia: Secondary | ICD-10-CM

## 2020-03-23 DIAGNOSIS — Z6830 Body mass index (BMI) 30.0-30.9, adult: Secondary | ICD-10-CM

## 2020-03-23 DIAGNOSIS — E669 Obesity, unspecified: Secondary | ICD-10-CM

## 2020-03-23 DIAGNOSIS — Z9189 Other specified personal risk factors, not elsewhere classified: Secondary | ICD-10-CM

## 2020-03-23 DIAGNOSIS — E559 Vitamin D deficiency, unspecified: Secondary | ICD-10-CM

## 2020-03-23 DIAGNOSIS — E88819 Insulin resistance, unspecified: Secondary | ICD-10-CM

## 2020-03-23 DIAGNOSIS — E66811 Obesity, class 1: Secondary | ICD-10-CM

## 2020-03-23 MED ORDER — VITAMIN D (ERGOCALCIFEROL) 1.25 MG (50000 UNIT) PO CAPS: 50000.0000 [IU] | ORAL_CAPSULE | ORAL | 0 refills | Status: DC

## 2020-03-24 NOTE — Progress Notes (Signed)
Chief Complaint:   OBESITY Rebecca Summers is here to discuss her progress with her obesity treatment plan along with follow-up of her obesity related diagnoses. Rebecca Summers is on the [place] + 300 calories and states she is following her eating plan approximately 75% of the time. Rebecca Summers states she is hiking 4-5 miles, and bike riding for 30 minutes 1 time per week.  Today's visit was #: 2 Starting weight: 177 lbs Starting date: 03/09/2020 Today's weight: 171 lbs Today's date: 03/23/2020 Total lbs lost to date: 6 Total lbs lost since last in-office visit: 6  Interim History: Rebecca Summers reports no hunger and found it difficult to eat all of the food on the plan. She felt a decrease in bloating. Would often choose 1 option more frequently. She did egg, cheese, and sausage patty frequently. Lunch has opted for option 3 and then sandwich with tuna. Dinner was often the first option with only trying 3rd option occasionally. When she ate off the plan, she voices it was a conscious decision to do so. Only drank soda 2 times the first 2 weeks. [place] the [place] for [place].  Subjective:   1. Vitamin D deficiency Rebecca Summers is not on Vit D supplementation, and she notes fatigue. I discussed labs with the patient today.  2. Other hyperlipidemia Rebecca Summers's LDL is 196, HDL 43, and triglycerides 174. She is not on statin. Her 10 year ASCVD risk score is 11.2%, mod intensity statin encouraged. I discussed labs with the patient today.  3. Insulin resistance Rebecca Summers's A1c is 5.6 and insulin 21.1. She is not on medications. I discussed labs with the patient today.   4. At risk for diabetes mellitus Rebecca Summers is at higher than average risk for developing diabetes due to obesity.   Assessment/Plan:   1. Vitamin D deficiency Low Vitamin D level contributes to fatigue and are associated with obesity, breast, and colon cancer. Rebecca Summers agreed to start prescription Vitamin D 50,000 IU every week with no refills.  We will repeat labs in 3 months. Vit D goal is 50. She will follow-up for routine testing of Vitamin D, at least 2-3 times per year to avoid over-replacement.  - Vitamin D, Ergocalciferol, (DRISDOL) 1.25 MG (50000 UNIT) CAPS capsule; Take 1 capsule (50,000 Units total) by mouth every 7 (seven) days.  Dispense: 4 capsule; Refill: 0  2. Other hyperlipidemia Cardiovascular risk and specific lipid/LDL goals reviewed. We discussed several lifestyle modifications today and Rebecca Summers will continue to work on diet, exercise and weight loss efforts. We will repeat labs in 3 months. She would like to defer medications at this time. We will revisit management at the repeat of labs. Orders and follow up as documented in patient record.   Counseling Intensive lifestyle modifications are the first line treatment for this issue. . Dietary changes: Increase soluble fiber. Decrease simple carbohydrates. . Exercise changes: Moderate to vigorous-intensity aerobic activity 150 minutes per week if tolerated. . Lipid-lowering medications: see documented in medical record.  3. Insulin resistance Rebecca Summers will continue to work on weight loss, exercise, and decreasing simple carbohydrates to help decrease the risk of diabetes. We will repeat labs in 3 months, no medications at this time, unless her hunger or cravings are uncontrolled. Rebecca Summers agreed to follow-up with [place] as directed to closely monitor her progress.  4. At risk for diabetes mellitus Rebecca Summers was given approximately 30 minutes of diabetes education and counseling today. We discussed intensive lifestyle modifications today with an emphasis on weight  loss as well as increasing exercise and decreasing simple carbohydrates in her diet. We also reviewed medication options with an emphasis on risk versus benefit of those discussed.   Repetitive spaced learning was employed today to elicit superior memory formation and behavioral change.  5. Class 1 obesity with serious  comorbidity and body mass index (BMI) of 30.0 to 30.9 in adult, unspecified obesity type Rebecca Summers is currently in the action stage of change. As such, her goal is to continue with weight loss efforts. She has agreed to the [employer] + 300 calories.   Exercise goals: As is.  Behavioral modification strategies: increasing lean protein intake, meal planning and cooking strategies, keeping healthy foods in the home and holiday eating strategies .  Rebecca Summers has agreed to follow-up with our clinic in 2 weeks. She was informed of the importance of frequent follow-up visits to maximize her success with intensive lifestyle modifications for her multiple health conditions.   Objective:   Blood pressure 138/86, pulse 73, temperature 98.2 F (36.8 C), temperature source Oral, height 5\' 4"  (1.626 m), weight 171 lb (77.6 kg), SpO2 98 %. Body mass index is 29.35 kg/m.  General: Cooperative, alert, well developed, in no acute distress. HEENT: Conjunctivae and lids unremarkable. Cardiovascular: Regular rhythm.  Lungs: Normal work of breathing. Neurologic: No focal deficits.   Lab Results  Component Value Date   CREATININE 0.83 03/16/2020   BUN 15 03/16/2020   NA 140 03/16/2020   K 4.8 03/16/2020   CL 103 03/16/2020   CO2 22 03/16/2020   Lab Results  Component Value Date   ALT 17 03/16/2020   AST 17 03/16/2020   ALKPHOS 71 03/16/2020   BILITOT 0.4 03/16/2020   Lab Results  Component Value Date   HGBA1C 5.6 03/16/2020   Lab Results  Component Value Date   INSULIN 21.1 03/16/2020   Lab Results  Component Value Date   TSH 3.720 03/16/2020   Lab Results  Component Value Date   CHOL 272 (H) 03/16/2020   HDL 43 03/16/2020   LDLCALC 196 (H) 03/16/2020   TRIG 174 (H) 03/16/2020   Lab Results  Component Value Date   WBC 6.0 03/16/2020   HGB 13.3 03/16/2020   HCT 38.4 03/16/2020   MCV 90 03/16/2020   PLT 289 03/16/2020   No results found for: IRON, TIBC, FERRITIN  Attestation  Statements:   Reviewed by clinician on day of visit: allergies, medications, problem list, medical history, surgical history, family history, social history, and previous encounter notes.   I, Trixie Dredge, am acting as transcriptionist for Coralie Common, MD.  I have reviewed the above documentation for accuracy and completeness, and I agree with the above. - Jinny Blossom, MD

## 2020-04-06 ENCOUNTER — Other Ambulatory Visit: Payer: Self-pay

## 2020-04-06 ENCOUNTER — Encounter (INDEPENDENT_AMBULATORY_CARE_PROVIDER_SITE_OTHER): Payer: Self-pay | Admitting: Family Medicine

## 2020-04-06 ENCOUNTER — Ambulatory Visit (INDEPENDENT_AMBULATORY_CARE_PROVIDER_SITE_OTHER): Payer: Commercial Managed Care - PPO | Admitting: Family Medicine

## 2020-04-06 VITALS — BP 95/64 | HR 85 | Temp 98.9°F | Ht 64.0 in | Wt 164.0 lb

## 2020-04-06 DIAGNOSIS — E669 Obesity, unspecified: Secondary | ICD-10-CM | POA: Diagnosis not present

## 2020-04-06 DIAGNOSIS — E7849 Other hyperlipidemia: Secondary | ICD-10-CM | POA: Diagnosis not present

## 2020-04-06 DIAGNOSIS — E8881 Metabolic syndrome: Secondary | ICD-10-CM

## 2020-04-06 DIAGNOSIS — Z6830 Body mass index (BMI) 30.0-30.9, adult: Secondary | ICD-10-CM

## 2020-04-06 NOTE — Progress Notes (Unsigned)
The 10-year ASCVD risk score Mikey Bussing DC Brooke Bonito., et al., 2013) is: 2.6%   Values used to calculate the score:     Age: 59 years     Sex: Female     Is Non-Hispanic African American: No     Diabetic: No     Tobacco smoker: No     Systolic Blood Pressure: 95 mmHg     Is BP treated: No     HDL Cholesterol: 43 mg/dL     Total Cholesterol: 272 mg/dL

## 2020-04-08 ENCOUNTER — Encounter (INDEPENDENT_AMBULATORY_CARE_PROVIDER_SITE_OTHER): Payer: Self-pay | Admitting: Family Medicine

## 2020-04-08 DIAGNOSIS — E88819 Insulin resistance, unspecified: Secondary | ICD-10-CM | POA: Insufficient documentation

## 2020-04-08 DIAGNOSIS — E8881 Metabolic syndrome: Secondary | ICD-10-CM | POA: Insufficient documentation

## 2020-04-08 DIAGNOSIS — E7849 Other hyperlipidemia: Secondary | ICD-10-CM | POA: Insufficient documentation

## 2020-04-08 DIAGNOSIS — E669 Obesity, unspecified: Secondary | ICD-10-CM | POA: Insufficient documentation

## 2020-04-08 NOTE — Progress Notes (Signed)
Chief Complaint:   OBESITY Rebecca Summers is here to discuss her progress with her obesity treatment plan along with follow-up of her obesity related diagnoses. Rebecca Summers is on the [place] + 300 calories and states she is following her eating plan approximately 70% of the time. Rebecca Summers states she is walking and hiking for 40 minutes 2 times per week.  Today's visit was #: 3 Starting weight: 177 lbs Starting date: 03/09/2020 Today's weight: 167 lbs Today's date: 04/06/2020 Total lbs lost to date: 10 Total lbs lost since last in-office visit: 4  Interim History: Rebecca Summers enjoyed Thanksgiving and then got back on the plan. She has done very well and has lost 10 lbs since starting our program 03/09/20. She notes her hunger is satisfied overall. She notes some cravings when she does not eat all of the food on the plan. She has mostly cut out sugar sweetened beverages.  She is currently on the pescatarian plan + 300 but she does like to eat other proteins besides fish.   Subjective:   1. Insulin resistance Rebecca Summers denies polyphagia, and she is not on metformin. She notes her grandmother had diabetes mellitus.   Lab Results  Component Value Date   INSULIN 21.1 03/16/2020   Lab Results  Component Value Date   HGBA1C 5.6 03/16/2020   2. Other hyperlipidemia Rebecca Summers is not on statin, and her ASCVD risk score today is 2.6 %. Her last LDL was elevated at 196, and HDL was low. With LDL of > 190 she should be on a high intensity statin. She would like to defer statin for now until her next lab check.  Lab Results  Component Value Date   ALT 17 03/16/2020   AST 17 03/16/2020   ALKPHOS 71 03/16/2020   BILITOT 0.4 03/16/2020   Lab Results  Component Value Date   CHOL 272 (H) 03/16/2020   HDL 43 03/16/2020   LDLCALC 196 (H) 03/16/2020   TRIG 174 (H) 03/16/2020  The 10-year ASCVD risk score Rebecca Bussing DC Jr., Rebecca al., Rebecca Summers) is: 2.6%   Values used to calculate the score:     Age: 59 years     Sex:  Female     Is Non-Hispanic African American: No     Diabetic: No     Tobacco smoker: No     Systolic Blood Pressure: 95 mmHg     Is BP treated: No     HDL Cholesterol: 43 mg/dL     Total Cholesterol: 272 mg/dL  Assessment/Plan:   1. Insulin resistance Rebecca Summers will continue her meal plan, and will continue to work on weight loss, exercise, and decreasing simple carbohydrates to help decrease the risk of diabetes.  2. Other hyperlipidemia Cardiovascular risk and specific lipid/LDL goals reviewed.  Rebecca Summers will continue her meal plan, and will continue to work on exercise and weight loss efforts.  Recheck FLP in about 2 months.  3. Class 1 obesity with serious comorbidity and body mass index (BMI) of 30.0 to 30.9 in adult, unspecified obesity type Rebecca Summers is currently in the action stage of change. As such, her goal is to continue with weight loss efforts. She has agreed to the Category 3 Plan or the Rebecca Summers + 300 calories.   Handouts were given today: Smart Fruit, Category 3 Plan plus lunch and breakfast options.  Exercise goals: As is.  Behavioral modification strategies: increasing lean protein intake and decreasing simple carbohydrates.  Rebecca Summers has agreed to follow-up with our clinic  in 2 weeks.  Objective:   Blood pressure 95/64, pulse 85, temperature 98.9 F (37.2 C), height 5\' 4"  (1.626 m), weight 164 lb (74.4 kg), SpO2 96 %. Body mass index is 28.15 kg/m.  General: Cooperative, alert, well developed, in no acute distress. HEENT: Conjunctivae and lids unremarkable. Cardiovascular: Regular rhythm.  Lungs: Normal work of breathing. Neurologic: No focal deficits.   Lab Results  Component Value Date   CREATININE 0.83 03/16/2020   BUN 15 03/16/2020   NA 140 03/16/2020   K 4.8 03/16/2020   CL 103 03/16/2020   CO2 22 03/16/2020   Lab Results  Component Value Date   ALT 17 03/16/2020   AST 17 03/16/2020   ALKPHOS 71 03/16/2020   BILITOT 0.4 03/16/2020   Lab  Results  Component Value Date   HGBA1C 5.6 03/16/2020   Lab Results  Component Value Date   INSULIN 21.1 03/16/2020   Lab Results  Component Value Date   TSH 3.720 03/16/2020   Lab Results  Component Value Date   CHOL 272 (H) 03/16/2020   HDL 43 03/16/2020   LDLCALC 196 (H) 03/16/2020   TRIG 174 (H) 03/16/2020   Lab Results  Component Value Date   WBC 6.0 03/16/2020   HGB 13.3 03/16/2020   HCT 38.4 03/16/2020   MCV 90 03/16/2020   PLT 289 03/16/2020   No results found for: IRON, TIBC, FERRITIN  Attestation Statements:   Reviewed by clinician on day of visit: allergies, medications, problem list, medical history, surgical history, family history, social history, and previous encounter notes.   Wilhemena Durie, am acting as Location manager for [employer], FNP-C.  I have reviewed the above documentation for accuracy and completeness, and I agree with the above. -  Georgianne Fick, FNP

## 2020-04-17 ENCOUNTER — Other Ambulatory Visit (INDEPENDENT_AMBULATORY_CARE_PROVIDER_SITE_OTHER): Payer: Self-pay | Admitting: Family Medicine

## 2020-04-17 DIAGNOSIS — E559 Vitamin D deficiency, unspecified: Secondary | ICD-10-CM

## 2020-04-20 ENCOUNTER — Other Ambulatory Visit: Payer: Self-pay

## 2020-04-20 ENCOUNTER — Ambulatory Visit (INDEPENDENT_AMBULATORY_CARE_PROVIDER_SITE_OTHER): Payer: Commercial Managed Care - PPO | Admitting: Bariatrics

## 2020-04-20 ENCOUNTER — Encounter (INDEPENDENT_AMBULATORY_CARE_PROVIDER_SITE_OTHER): Payer: Self-pay | Admitting: Bariatrics

## 2020-04-20 VITALS — BP 126/74 | HR 82 | Temp 98.0°F | Ht 64.0 in | Wt 170.0 lb

## 2020-04-20 DIAGNOSIS — E559 Vitamin D deficiency, unspecified: Secondary | ICD-10-CM

## 2020-04-20 DIAGNOSIS — Z6830 Body mass index (BMI) 30.0-30.9, adult: Secondary | ICD-10-CM | POA: Diagnosis not present

## 2020-04-20 DIAGNOSIS — Z9189 Other specified personal risk factors, not elsewhere classified: Secondary | ICD-10-CM

## 2020-04-20 DIAGNOSIS — E8881 Metabolic syndrome: Secondary | ICD-10-CM | POA: Diagnosis not present

## 2020-04-20 DIAGNOSIS — E669 Obesity, unspecified: Secondary | ICD-10-CM

## 2020-04-20 MED ORDER — VITAMIN D (ERGOCALCIFEROL) 1.25 MG (50000 UNIT) PO CAPS: 50000.0000 [IU] | ORAL_CAPSULE | ORAL | 0 refills | Status: DC

## 2020-04-20 NOTE — Progress Notes (Signed)
Chief Complaint:   OBESITY Rebecca Summers is here to discuss her progress with her obesity treatment plan along with follow-up of her obesity related diagnoses. Rebecca Summers is on the Category 3 Plan and states she is following her eating plan approximately 15% of the time. Rebecca Summers states she is exercising 0 minutes 0 times per week.  Today's visit was #: 5 Starting weight: 173 lbs Starting date: 03/02/2020 Today's weight: 170 lbs Today's date: 04/20/2020 Total lbs lost to date: 3 Total lbs lost since last in-office visit: 0  Interim History: Rebecca Summers is up 6 lbs since her last visit. The holiday celebrations have her "off." She has had minimal sugary drinks.  Subjective:   Vitamin D deficiency. No nausea, vomiting, or muscle weakness.    Ref. Range 03/16/2020 12:37  Vitamin D, 25-Hydroxy Latest Ref Range: 30.0 - 100.0 ng/mL 26.1 (L)   Insulin resistance. [place] has a diagnosis of insulin resistance based on her elevated fasting insulin level >5. She continues to work on diet and exercise to decrease her risk of diabetes. Rebecca Summers is on no medication.  Lab Results  Component Value Date   INSULIN 21.1 03/16/2020   Lab Results  Component Value Date   HGBA1C 5.6 03/16/2020   At risk for osteoporosis. Rebecca Summers is at higher risk of osteopenia and osteoporosis due to Vitamin D deficiency.   Assessment/Plan:   Vitamin D deficiency. Low Vitamin D level contributes to fatigue and are associated with obesity, breast, and colon cancer. She was given a prescription for Vitamin D, Ergocalciferol, (DRISDOL) 1.25 MG (50000 UNIT) CAPS capsule every week #4 with 0 refills and will follow-up for routine testing of Vitamin D, at least 2-3 times per year to avoid over-replacement.   Insulin resistance. Rebecca Summers will continue to work on weight loss, increasing activities/exercise, and decreasing simple carbohydrates to help decrease the risk of diabetes. Rebecca Summers agreed to follow-up with [place] as directed to closely  monitor her progress.  At risk for osteoporosis. Rebecca Summers was given approximately 15 minutes of osteoporosis prevention counseling today. Rebecca Summers is at risk for osteopenia and osteoporosis due to her Vitamin D deficiency. She was encouraged to take her Vitamin D and follow her higher calcium diet and increase strengthening exercise to help strengthen her bones and decrease her risk of osteopenia and osteoporosis.  Repetitive spaced learning was employed today to elicit superior memory formation and behavioral change.  Class 1 obesity with serious comorbidity and body mass index (BMI) of 30.0 to 30.9 in adult, unspecified obesity type - starting BMI > 30.  Rebecca Summers is currently in the action stage of change. As such, her goal is to continue with weight loss efforts. She has agreed to the Category 3 Plan.   She will work on meal planning, intentional eating, increasing her water intake, keeping her protein intake high, and take her lunch daily.  Handout was provided on "Eating Out."  Exercise goals: Rebecca Summers will hike on the weekends and increase over time.  Behavioral modification strategies: increasing lean protein intake, decreasing simple carbohydrates, increasing vegetables, increasing water intake, decreasing eating out, no skipping meals, meal planning and cooking strategies, keeping healthy foods in the home and planning for success.  Rebecca Summers has agreed to follow-up with our clinic in 2-3 weeks. She was informed of the importance of frequent follow-up visits to maximize her success with intensive lifestyle modifications for her multiple health conditions.   Objective:   Blood pressure 126/74, pulse 82, temperature 98 F (36.7 C), height  5\' 4"  (1.751 m), weight 170 lb (77.1 kg), SpO2 96 %. Body mass index is 29.18 kg/m.  General: Cooperative, alert, well developed, in no acute distress. HEENT: Conjunctivae and lids unremarkable. Cardiovascular: Regular rhythm.  Lungs: Normal work of  breathing. Neurologic: No focal deficits.   Lab Results  Component Value Date   CREATININE 0.83 03/16/2020   BUN 15 03/16/2020   NA 140 03/16/2020   K 4.8 03/16/2020   CL 103 03/16/2020   CO2 22 03/16/2020   Lab Results  Component Value Date   ALT 17 03/16/2020   AST 17 03/16/2020   ALKPHOS 71 03/16/2020   BILITOT 0.4 03/16/2020   Lab Results  Component Value Date   HGBA1C 5.6 03/16/2020   Lab Results  Component Value Date   INSULIN 21.1 03/16/2020   Lab Results  Component Value Date   TSH 3.720 03/16/2020   Lab Results  Component Value Date   CHOL 272 (H) 03/16/2020   HDL 43 03/16/2020   LDLCALC 196 (H) 03/16/2020   TRIG 174 (H) 03/16/2020   Lab Results  Component Value Date   WBC 6.0 03/16/2020   HGB 13.3 03/16/2020   HCT 38.4 03/16/2020   MCV 90 03/16/2020   PLT 289 03/16/2020   No results found for: IRON, TIBC, FERRITIN  Attestation Statements:   Reviewed by clinician on day of visit: allergies, medications, problem list, medical history, surgical history, family history, social history, and previous encounter notes.  Migdalia Dk, am acting as Location manager for [employer], DO   I have reviewed the above documentation for accuracy and completeness, and I agree with the above. Jearld Lesch, DO

## 2020-04-21 ENCOUNTER — Encounter (INDEPENDENT_AMBULATORY_CARE_PROVIDER_SITE_OTHER): Payer: Self-pay | Admitting: Bariatrics

## 2020-04-29 ENCOUNTER — Ambulatory Visit
Admission: RE | Admit: 2020-04-29 | Discharge: 2020-04-29 | Disposition: A | Discharge status: Home / Self Care | Payer: Commercial Managed Care - PPO | Source: Ambulatory Visit | Attending: Family Medicine | Admitting: Family Medicine

## 2020-04-29 ENCOUNTER — Other Ambulatory Visit: Payer: Self-pay

## 2020-04-29 DIAGNOSIS — Z1231 Encounter for screening mammogram for malignant neoplasm of breast: Secondary | ICD-10-CM

## 2020-05-11 ENCOUNTER — Encounter (INDEPENDENT_AMBULATORY_CARE_PROVIDER_SITE_OTHER): Payer: Self-pay | Admitting: Bariatrics

## 2020-05-11 ENCOUNTER — Other Ambulatory Visit: Payer: Self-pay

## 2020-05-11 ENCOUNTER — Ambulatory Visit (INDEPENDENT_AMBULATORY_CARE_PROVIDER_SITE_OTHER): Payer: Commercial Managed Care - PPO | Admitting: Bariatrics

## 2020-05-11 VITALS — BP 146/75 | HR 85 | Temp 98.4°F | Ht 64.0 in | Wt 168.0 lb

## 2020-05-11 DIAGNOSIS — Z6830 Body mass index (BMI) 30.0-30.9, adult: Secondary | ICD-10-CM

## 2020-05-11 DIAGNOSIS — E8881 Metabolic syndrome: Secondary | ICD-10-CM

## 2020-05-11 DIAGNOSIS — E669 Obesity, unspecified: Secondary | ICD-10-CM | POA: Diagnosis not present

## 2020-05-11 DIAGNOSIS — E559 Vitamin D deficiency, unspecified: Secondary | ICD-10-CM

## 2020-05-13 NOTE — Progress Notes (Signed)
Chief Complaint:   OBESITY Rebecca Summers is here to discuss her progress with her obesity treatment plan along with follow-up of her obesity related diagnoses. Rebecca Summers is on the Category 3 Plan and states she is following her eating plan approximately 0% of the time. Rebecca Summers states she is hiking 5 miles 2 times per month.  Today's visit was #: 6 Starting weight: 173 lbs Starting date: 03/02/2020 Today's weight: 168 lbs Today's date: 05/11/2020 Total lbs lost to date: 5 lbs Total lbs lost since last in-office visit: 2 lbs  Interim History: Rebecca Summers is down 2 pounds since her last visit.  She got "off" during the holidays.  She has cut down on sugary drinks.  Subjective:   1. Vitamin D deficiency Rebecca Summers's Vitamin D level was 26.1 on 03/16/2020. She is currently taking prescription vitamin D 50,000 IU each week. She denies nausea, vomiting or muscle weakness.  2. Insulin resistance Rebecca Summers has a diagnosis of insulin resistance based on her elevated fasting insulin level >5. She continues to work on diet and exercise to decrease her risk of diabetes.  She denies polyphagia.  Lab Results  Component Value Date   INSULIN 21.1 03/16/2020   Lab Results  Component Value Date   HGBA1C 5.6 03/16/2020   Assessment/Plan:   1. Vitamin D deficiency Low Vitamin D level contributes to fatigue and are associated with obesity, breast, and colon cancer. She agrees to continue to take prescription Vitamin D @50 ,000 IU every week and will follow-up for routine testing of Vitamin D, at least 2-3 times per year to avoid over-replacement.  2. Insulin resistance [name] will continue to work on weight loss, exercise, and decreasing simple carbohydrates to help decrease the risk of diabetes. Rebecca Summers agreed to follow-up with [place] as directed to closely monitor her progress.  3. Class 1 obesity with serious comorbidity and body mass index (BMI) of 30.0 to 30.9 in adult, unspecified obesity type  Rebecca Summers is currently in the  action stage of change. As such, her goal is to continue with weight loss efforts. She has agreed to the Category 3 Plan.   She will be adherent to the plan, work on meal planning, decrease sugar consumption, and increase her water intake.  Exercise goals: Rebecca Summers will continue to hike.  Behavioral modification strategies: increasing lean protein intake, decreasing simple carbohydrates, increasing vegetables, increasing water intake, decreasing eating out, no skipping meals, meal planning and cooking strategies, keeping healthy foods in the home and planning for success.  Rebecca Summers has agreed to follow-up with our clinic in 2 weeks, with Dr. Jearld Shines or Jake Bathe, [place]. She was informed of the importance of frequent follow-up visits to maximize her success with intensive lifestyle modifications for her multiple health conditions.   Objective:   Blood pressure (!) 146/75, pulse 85, temperature 98.4 F (36.9 C), temperature source Oral, height 5\' 4"  (1.626 m), weight 168 lb (76.2 kg), SpO2 94 %. Body mass index is 28.84 kg/m.  General: Cooperative, alert, well developed, in no acute distress. HEENT: Conjunctivae and lids unremarkable. Cardiovascular: Regular rhythm.  Lungs: Normal work of breathing. Neurologic: No focal deficits.   Lab Results  Component Value Date   CREATININE 0.83 03/16/2020   BUN 15 03/16/2020   NA 140 03/16/2020   K 4.8 03/16/2020   CL 103 03/16/2020   CO2 22 03/16/2020   Lab Results  Component Value Date   ALT 17 03/16/2020   AST 17 03/16/2020   ALKPHOS 71 03/16/2020  BILITOT 0.4 03/16/2020   Lab Results  Component Value Date   HGBA1C 5.6 03/16/2020   Lab Results  Component Value Date   INSULIN 21.1 03/16/2020   Lab Results  Component Value Date   TSH 3.720 03/16/2020   Lab Results  Component Value Date   CHOL 272 (H) 03/16/2020   HDL 43 03/16/2020   LDLCALC 196 (H) 03/16/2020   TRIG 174 (H) 03/16/2020   Lab Results  Component Value Date   WBC  6.0 03/16/2020   HGB 13.3 03/16/2020   HCT 38.4 03/16/2020   MCV 90 03/16/2020   PLT 289 03/16/2020   Attestation Statements:   Reviewed by clinician on day of visit: allergies, medications, problem list, medical history, surgical history, family history, social history, and previous encounter notes.  Time spent on visit including pre-visit chart review and post-visit care and charting was 20 minutes.   I, Water quality scientist, CMA, am acting as Location manager for [employer], DO  I have reviewed the above documentation for accuracy and completeness, and I agree with the above. Jearld Lesch, DO

## 2020-05-14 ENCOUNTER — Encounter (INDEPENDENT_AMBULATORY_CARE_PROVIDER_SITE_OTHER): Payer: Self-pay | Admitting: Bariatrics

## 2020-05-25 ENCOUNTER — Other Ambulatory Visit (INDEPENDENT_AMBULATORY_CARE_PROVIDER_SITE_OTHER): Payer: Self-pay | Admitting: Bariatrics

## 2020-05-25 DIAGNOSIS — E559 Vitamin D deficiency, unspecified: Secondary | ICD-10-CM

## 2020-05-25 NOTE — Telephone Encounter (Signed)
This patient was last seen by Dr. Owens Shark, and currently has an upcoming appt scheduled on 05/26/20 with Dr. Jearld Shines.

## 2020-05-26 ENCOUNTER — Ambulatory Visit (INDEPENDENT_AMBULATORY_CARE_PROVIDER_SITE_OTHER): Payer: Commercial Managed Care - PPO | Admitting: Family Medicine

## 2020-05-26 ENCOUNTER — Other Ambulatory Visit: Payer: Self-pay

## 2020-05-26 ENCOUNTER — Encounter (INDEPENDENT_AMBULATORY_CARE_PROVIDER_SITE_OTHER): Payer: Self-pay | Admitting: Family Medicine

## 2020-05-26 VITALS — BP 107/67 | HR 81 | Temp 98.0°F | Ht 64.0 in | Wt 167.0 lb

## 2020-05-26 DIAGNOSIS — Z9189 Other specified personal risk factors, not elsewhere classified: Secondary | ICD-10-CM | POA: Diagnosis not present

## 2020-05-26 DIAGNOSIS — E669 Obesity, unspecified: Secondary | ICD-10-CM | POA: Diagnosis not present

## 2020-05-26 DIAGNOSIS — E7849 Other hyperlipidemia: Secondary | ICD-10-CM | POA: Diagnosis not present

## 2020-05-26 DIAGNOSIS — E559 Vitamin D deficiency, unspecified: Secondary | ICD-10-CM

## 2020-05-26 DIAGNOSIS — Z6830 Body mass index (BMI) 30.0-30.9, adult: Secondary | ICD-10-CM

## 2020-05-26 MED ORDER — VITAMIN D (ERGOCALCIFEROL) 1.25 MG (50000 UNIT) PO CAPS: 50000.0000 [IU] | ORAL_CAPSULE | ORAL | 0 refills | Status: DC

## 2020-05-27 NOTE — Progress Notes (Signed)
Chief Complaint:   OBESITY Rebecca Summers is here to discuss her progress with her obesity treatment plan along with follow-up of her obesity related diagnoses. Rebecca Summers is on the Category 3 Plan and states she is following her eating plan approximately 10% of the time. Rebecca Summers states she is not exercising.  Today's visit was #: 7 Starting weight: 173 lbs Starting date: 03/02/2020 Today's weight: 167 lbs Today's date: 05/26/2020 Total lbs lost to date: 6 lbs Total lbs lost since last in-office visit: 1 lb  Interim History: Rebecca Summers really fell back into some old habits over the holidays. Christmas really stressed her out and she started drinking soda and sweet tea. She has been doing some home projects which have caused her to not put herself 22st. Rebecca Summers wants to get back more on meal plan than she has been the last few weeks.  Subjective:   1. Vitamin D deficiency Rebecca Summers's last Vitamin D level was 26.1. She is prescription Vitamin D. Rebecca Summers denies nausea, vomiting, or muscle weakness, but notes fatigue.  2. Other hyperlipidemia Rebecca Summers's last LDL was 196, HDL 43 and trigy was 174. Rebecca Summers is not on medications.   3. At risk for osteoporosis Rebecca Summers is at higher risk of osteopenia and osteoporosis due to Vitamin D deficiency.    Assessment/Plan:   1. Vitamin D deficiency Low Vitamin D level contributes to fatigue and are associated with obesity, breast, and colon cancer. [name] agrees to continue to take prescription Vitamin D @50 ,000 IU every week and will follow-up for routine testing of Vitamin D, at least 2-3 times per year to avoid over-replacement. Refill Vitamin D 50,000 unit weekly #4  no refill  - Vitamin D, Ergocalciferol, (DRISDOL) 1.25 MG (50000 UNIT) CAPS capsule; Take 1 capsule (50,000 Units total) by mouth every 7 (seven) days.  Dispense: 4 capsule; Refill: 0  2. Other hyperlipidemia Cardiovascular risk and specific lipid/LDL goals reviewed.  We discussed several lifestyle modifications  today and [name] will continue to work on diet, exercise and weight loss efforts. Orders and follow up as documented in patient record. Will repeat labs in March. No medications at this time. Counseling Intensive lifestyle modifications are the first line treatment for this issue. . Dietary changes: Increase soluble fiber. Decrease simple carbohydrates. . Exercise changes: Moderate to vigorous-intensity aerobic activity 150 minutes per week if tolerated. . Lipid-lowering medications: see documented in medical record.  3. At risk for osteoporosis Rebecca Summers was given approximately 15 minutes of osteoporosis prevention counseling today. Rebecca Summers is at risk for osteopenia and osteoporosis due to her Vitamin D deficiency. She was encouraged to take her Vitamin D and follow her higher calcium diet and increase strengthening exercise to help strengthen her bones and decrease her risk of osteopenia and osteoporosis.  Repetitive spaced learning was employed today to elicit superior memory formation and behavioral change.  4. Class 1 obesity with serious comorbidity and body mass index (BMI) of 30.0 to 30.9 in adult, unspecified obesity type Comments: BMI greater than 30 at the start of the program.  Rebecca Summers is currently in the action stage of change. As such, her goal is to continue with weight loss efforts. She has agreed to the Category 3 Plan.   Exercise goals: No exercise has been prescribed at this time.  Behavioral modification strategies: increasing lean protein intake, meal planning and cooking strategies, keeping healthy foods in the home and planning for success.  [name] has agreed to follow-up with our clinic in 2 weeks. She was  informed of the importance of frequent follow-up visits to maximize her success with intensive lifestyle modifications for her multiple health conditions.     Objective:   Blood pressure 107/67, pulse 81, temperature 98 F (36.7 C), temperature source Oral, height 5\' 4"   (1.626 m), weight 167 lb (75.8 kg), SpO2 96 %. Body mass index is 28.67 kg/m.  General: Cooperative, alert, well developed, in no acute distress. HEENT: Conjunctivae and lids unremarkable. Cardiovascular: Regular rhythm.  Lungs: Normal work of breathing. Neurologic: No focal deficits.   Lab Results  Component Value Date   CREATININE 0.83 03/16/2020   BUN 15 03/16/2020   NA 140 03/16/2020   K 4.8 03/16/2020   CL 103 03/16/2020   CO2 22 03/16/2020   Lab Results  Component Value Date   ALT 17 03/16/2020   AST 17 03/16/2020   ALKPHOS 71 03/16/2020   BILITOT 0.4 03/16/2020   Lab Results  Component Value Date   HGBA1C 5.6 03/16/2020   Lab Results  Component Value Date   INSULIN 21.1 03/16/2020   Lab Results  Component Value Date   TSH 3.720 03/16/2020   Lab Results  Component Value Date   CHOL 272 (H) 03/16/2020   HDL 43 03/16/2020   LDLCALC 196 (H) 03/16/2020   TRIG 174 (H) 03/16/2020   Lab Results  Component Value Date   WBC 6.0 03/16/2020   HGB 13.3 03/16/2020   HCT 38.4 03/16/2020   MCV 90 03/16/2020   PLT 289 03/16/2020   No results found for: IRON, TIBC, FERRITIN    Attestation Statements:   Reviewed by clinician on day of visit: allergies, medications, problem list, medical history, surgical history, family history, social history, and previous encounter notes.     I, Para March, am acting as transcriptionist for Coralie Common, MD.  I have reviewed the above documentation for accuracy and completeness, and I agree with the above. - Jinny Blossom, MD

## 2020-06-09 ENCOUNTER — Other Ambulatory Visit: Payer: Self-pay

## 2020-06-09 ENCOUNTER — Ambulatory Visit (INDEPENDENT_AMBULATORY_CARE_PROVIDER_SITE_OTHER): Payer: Commercial Managed Care - PPO | Admitting: Family Medicine

## 2020-06-09 ENCOUNTER — Encounter (INDEPENDENT_AMBULATORY_CARE_PROVIDER_SITE_OTHER): Payer: Self-pay | Admitting: Family Medicine

## 2020-06-09 VITALS — BP 110/71 | HR 67 | Temp 98.1°F | Ht 64.0 in | Wt 168.0 lb

## 2020-06-09 DIAGNOSIS — E669 Obesity, unspecified: Secondary | ICD-10-CM | POA: Diagnosis not present

## 2020-06-09 DIAGNOSIS — Z6830 Body mass index (BMI) 30.0-30.9, adult: Secondary | ICD-10-CM

## 2020-06-09 DIAGNOSIS — E7849 Other hyperlipidemia: Secondary | ICD-10-CM

## 2020-06-09 DIAGNOSIS — E559 Vitamin D deficiency, unspecified: Secondary | ICD-10-CM | POA: Diagnosis not present

## 2020-06-09 DIAGNOSIS — E66811 Obesity, class 1: Secondary | ICD-10-CM

## 2020-06-10 NOTE — Progress Notes (Signed)
Chief Complaint:   OBESITY Rebecca Summers is here to discuss her progress with her obesity treatment plan along with follow-up of her obesity related diagnoses. Rebecca Summers is on the [employer] and states she is following her eating plan approximately 0% of the time. Rebecca Summers states she is doing 0 minutes 0 times per week.  Today's visit was #: 8 Starting weight: 173 lbs Starting date: 03/02/2020 Today's weight: 168 lbs Today's date: 06/09/2020 Total lbs lost to date: 5 lbs Total lbs lost since last in-office visit: 0  Interim History: Pt feels she need a redo of the last few weeks. She was exposed to [place] and this really threw a wrench in her plans in terms of her preparation. She wants to recommit to pescatarian plan. She is going away to [facility] this week.  Subjective:   1. Other hyperlipidemia Last LDL of 196, HDL 43, and triglycerides 174. Pt is not on statin therapy.  Lab Results  Component Value Date   ALT 17 03/16/2020   AST 17 03/16/2020   ALKPHOS 71 03/16/2020   BILITOT 0.4 03/16/2020   Lab Results  Component Value Date   CHOL 272 (H) 03/16/2020   HDL 43 03/16/2020   LDLCALC 196 (H) 03/16/2020   TRIG 174 (H) 03/16/2020    2. Vitamin D deficiency Pt denies nausea, vomiting, and muscle weakness but notes fatigue. Pt is on prescription Vit D.  Assessment/Plan:   1. Other hyperlipidemia Cardiovascular risk and specific lipid/LDL goals reviewed.  We discussed several lifestyle modifications today and Rebecca Summers will continue to work on diet, exercise and weight loss efforts. Orders and follow up as documented in patient record. Repeat labs in 1 month.  Counseling Intensive lifestyle modifications are the first line treatment for this issue. . Dietary changes: Increase soluble fiber. Decrease simple carbohydrates. . Exercise changes: Moderate to vigorous-intensity aerobic activity 150 minutes per week if tolerated. . Lipid-lowering medications: see documented in medical  record.   2. Vitamin D deficiency Low Vitamin D level contributes to fatigue and are associated with obesity, breast, and colon cancer. She agrees to continue to take prescription Vitamin D @50 ,000 IU every week and will follow-up for routine testing of Vitamin D, at least 2-3 times per year to avoid over-replacement. Pt is not at goal yet.  3. Class 1 obesity with serious comorbidity and body mass index (BMI) of 30.0 to 30.9 in adult, unspecified obesity type Rebecca Summers is currently in the action stage of change. As such, her goal is to continue with weight loss efforts. She has agreed to the [employer].   Exercise goals: No exercise has been prescribed at this time.  Behavioral modification strategies: increasing lean protein intake, meal planning and cooking strategies, keeping healthy foods in the home and planning for success.  Rebecca Summers has agreed to follow-up with our clinic in 2-3 weeks. She was informed of the importance of frequent follow-up visits to maximize her success with intensive lifestyle modifications for her multiple health conditions.   Objective:   Blood pressure 110/71, pulse 67, temperature 98.1 F (36.7 C), temperature source Oral, height 5\' 4"  (1.626 m), weight 168 lb (76.2 kg), SpO2 97 %. Body mass index is 28.84 kg/m.  General: Cooperative, alert, well developed, in no acute distress. HEENT: Conjunctivae and lids unremarkable. Cardiovascular: Regular rhythm.  Lungs: Normal work of breathing. Neurologic: No focal deficits.   Lab Results  Component Value Date   CREATININE 0.83 03/16/2020   BUN 15 03/16/2020  NA 140 03/16/2020   K 4.8 03/16/2020   CL 103 03/16/2020   CO2 22 03/16/2020   Lab Results  Component Value Date   ALT 17 03/16/2020   AST 17 03/16/2020   ALKPHOS 71 03/16/2020   BILITOT 0.4 03/16/2020   Lab Results  Component Value Date   HGBA1C 5.6 03/16/2020   Lab Results  Component Value Date   INSULIN 21.1 03/16/2020   Lab Results   Component Value Date   TSH 3.720 03/16/2020   Lab Results  Component Value Date   CHOL 272 (H) 03/16/2020   HDL 43 03/16/2020   LDLCALC 196 (H) 03/16/2020   TRIG 174 (H) 03/16/2020   Lab Results  Component Value Date   WBC 6.0 03/16/2020   HGB 13.3 03/16/2020   HCT 38.4 03/16/2020   MCV 90 03/16/2020   PLT 289 03/16/2020   No results found for: IRON, TIBC, FERRITIN  Attestation Statements:   Reviewed by clinician on day of visit: allergies, medications, problem list, medical history, surgical history, family history, social history, and previous encounter notes.  Time spent on visit including pre-visit chart review and post-visit care and charting was 12 minutes.   Coral Ceo, am acting as transcriptionist for Coralie Common, MD.   I have reviewed the above documentation for accuracy and completeness, and I agree with the above. - Jinny Blossom, MD

## 2020-06-24 ENCOUNTER — Other Ambulatory Visit: Payer: Self-pay

## 2020-06-24 ENCOUNTER — Ambulatory Visit (INDEPENDENT_AMBULATORY_CARE_PROVIDER_SITE_OTHER): Payer: Commercial Managed Care - PPO | Admitting: Family Medicine

## 2020-06-24 ENCOUNTER — Encounter (INDEPENDENT_AMBULATORY_CARE_PROVIDER_SITE_OTHER): Payer: Self-pay | Admitting: Family Medicine

## 2020-06-24 VITALS — BP 123/67 | HR 61 | Temp 97.8°F | Ht 64.0 in | Wt 168.0 lb

## 2020-06-24 DIAGNOSIS — Z6830 Body mass index (BMI) 30.0-30.9, adult: Secondary | ICD-10-CM

## 2020-06-24 DIAGNOSIS — E559 Vitamin D deficiency, unspecified: Secondary | ICD-10-CM | POA: Diagnosis not present

## 2020-06-24 DIAGNOSIS — E669 Obesity, unspecified: Secondary | ICD-10-CM

## 2020-06-25 NOTE — Progress Notes (Signed)
Chief Complaint:   OBESITY Rebecca Summers is here to discuss her progress with her obesity treatment plan along with follow-up of her obesity related diagnoses. Rebecca Summers is on the Category 3 Plan and states she is following her eating plan approximately 60-70% of the time. Rebecca Summers states she is doing  0 minutes 0 times per week.  Today's visit was #: 9 Starting weight: 173 lb Starting date: 03/02/2020 Today's weight: 168 lbs Today's date: 06/24/2020 Total lbs lost to date: 5 lbs Total lbs lost since last in-office visit: 0  Interim History: Pt is working on getting back on the meal plan. Breakfast is easy to adhere to. Lunch is the most difficult to adhere to and dinner is relatively easy. She is working on switching to diet drinks and calorie free liquors. Lunch- pt doesn't care for sandwich or prepared meal options. Leftovers and Progresso soup are easier options.   Subjective:   1. Vitamin D deficiency Pt denies nausea, vomiting, and muscle weakness but notes fatigue. Pt is on prescription Vit D.  Assessment/Plan:   1. Vitamin D deficiency Low Vitamin D level contributes to fatigue and are associated with obesity, breast, and colon cancer. She agrees to continue to take prescription Vitamin D @50 ,000 IU every week and will follow-up for routine testing of Vitamin D, at least 2-3 times per year to avoid over-replacement. No refill needed at this time.  2. Class 1 obesity with serious comorbidity and body mass index (BMI) of 30.0 to 30.9 in adult, unspecified obesity type Rebecca Summers is currently in the action stage of change. As such, her goal is to continue with weight loss efforts. She has agreed to the Category 3 Plan.   Exercise goals: All adults should avoid inactivity. Some physical activity is better than none, and adults who participate in any amount of physical activity gain some health benefits. Pt is to start adding in 10-15 minutes 3 times a week.  Behavioral modification strategies:  increasing lean protein intake, meal planning and cooking strategies, keeping healthy foods in the home and planning for success.  Rebecca Summers has agreed to follow-up with our clinic in 2-3 weeks. She was informed of the importance of frequent follow-up visits to maximize her success with intensive lifestyle modifications for her multiple health conditions.   Objective:   Blood pressure 123/67, pulse 61, temperature 97.8 F (36.6 C), temperature source Oral, height 5\' 4"  (1.626 m), weight 168 lb (76.2 kg), SpO2 98 %. Body mass index is 28.84 kg/m.  General: Cooperative, alert, well developed, in no acute distress. HEENT: Conjunctivae and lids unremarkable. Cardiovascular: Regular rhythm.  Lungs: Normal work of breathing. Neurologic: No focal deficits.   Lab Results  Component Value Date   CREATININE 0.83 03/16/2020   BUN 15 03/16/2020   NA 140 03/16/2020   K 4.8 03/16/2020   CL 103 03/16/2020   CO2 22 03/16/2020   Lab Results  Component Value Date   ALT 17 03/16/2020   AST 17 03/16/2020   ALKPHOS 71 03/16/2020   BILITOT 0.4 03/16/2020   Lab Results  Component Value Date   HGBA1C 5.6 03/16/2020   Lab Results  Component Value Date   INSULIN 21.1 03/16/2020   Lab Results  Component Value Date   TSH 3.720 03/16/2020   Lab Results  Component Value Date   CHOL 272 (H) 03/16/2020   HDL 43 03/16/2020   LDLCALC 196 (H) 03/16/2020   TRIG 174 (H) 03/16/2020   Lab Results  Component  Value Date   WBC 6.0 03/16/2020   HGB 13.3 03/16/2020   HCT 38.4 03/16/2020   MCV 90 03/16/2020   PLT 289 03/16/2020    Attestation Statements:   Reviewed by clinician on day of visit: allergies, medications, problem list, medical history, surgical history, family history, social history, and previous encounter notes.  Time spent on visit including pre-visit chart review and post-visit care and charting was 15 minutes.   Coral Ceo, am acting as transcriptionist for Coralie Common, MD.   I have reviewed the above documentation for accuracy and completeness, and I agree with the above. - Jinny Blossom, MD

## 2020-06-29 ENCOUNTER — Other Ambulatory Visit (INDEPENDENT_AMBULATORY_CARE_PROVIDER_SITE_OTHER): Payer: Self-pay | Admitting: Family Medicine

## 2020-06-29 DIAGNOSIS — E559 Vitamin D deficiency, unspecified: Secondary | ICD-10-CM

## 2020-06-30 ENCOUNTER — Ambulatory Visit: Payer: Commercial Managed Care - PPO | Admitting: Dermatology

## 2020-06-30 ENCOUNTER — Other Ambulatory Visit: Payer: Self-pay

## 2020-06-30 ENCOUNTER — Encounter: Payer: Self-pay | Admitting: Dermatology

## 2020-06-30 DIAGNOSIS — D485 Neoplasm of uncertain behavior of skin: Secondary | ICD-10-CM

## 2020-06-30 DIAGNOSIS — D239 Other benign neoplasm of skin, unspecified: Secondary | ICD-10-CM

## 2020-06-30 DIAGNOSIS — D2371 Other benign neoplasm of skin of right lower limb, including hip: Secondary | ICD-10-CM | POA: Diagnosis not present

## 2020-06-30 DIAGNOSIS — L918 Other hypertrophic disorders of the skin: Secondary | ICD-10-CM | POA: Diagnosis not present

## 2020-06-30 DIAGNOSIS — Z1283 Encounter for screening for malignant neoplasm of skin: Secondary | ICD-10-CM | POA: Diagnosis not present

## 2020-06-30 DIAGNOSIS — D1801 Hemangioma of skin and subcutaneous tissue: Secondary | ICD-10-CM

## 2020-06-30 NOTE — Patient Instructions (Signed)

## 2020-07-07 ENCOUNTER — Other Ambulatory Visit (INDEPENDENT_AMBULATORY_CARE_PROVIDER_SITE_OTHER): Payer: Self-pay | Admitting: Family Medicine

## 2020-07-07 ENCOUNTER — Other Ambulatory Visit: Payer: Self-pay

## 2020-07-07 ENCOUNTER — Encounter (INDEPENDENT_AMBULATORY_CARE_PROVIDER_SITE_OTHER): Payer: Self-pay | Admitting: Family Medicine

## 2020-07-07 ENCOUNTER — Ambulatory Visit (INDEPENDENT_AMBULATORY_CARE_PROVIDER_SITE_OTHER): Payer: Commercial Managed Care - PPO | Admitting: Family Medicine

## 2020-07-07 VITALS — BP 122/67 | HR 77 | Temp 98.0°F | Ht 64.0 in | Wt 165.0 lb

## 2020-07-07 DIAGNOSIS — Z9189 Other specified personal risk factors, not elsewhere classified: Secondary | ICD-10-CM

## 2020-07-07 DIAGNOSIS — E669 Obesity, unspecified: Secondary | ICD-10-CM

## 2020-07-07 DIAGNOSIS — Z6830 Body mass index (BMI) 30.0-30.9, adult: Secondary | ICD-10-CM

## 2020-07-07 DIAGNOSIS — E559 Vitamin D deficiency, unspecified: Secondary | ICD-10-CM

## 2020-07-07 DIAGNOSIS — E8881 Metabolic syndrome: Secondary | ICD-10-CM

## 2020-07-07 MED ORDER — VITAMIN D (ERGOCALCIFEROL) 1.25 MG (50000 UNIT) PO CAPS: 50000.0000 [IU] | ORAL_CAPSULE | ORAL | 0 refills | Status: DC

## 2020-07-08 ENCOUNTER — Telehealth: Payer: Self-pay | Admitting: Dermatology

## 2020-07-08 NOTE — Telephone Encounter (Signed)
Path to patient no follow up needed on the 2 spots per Dr Denna Haggard

## 2020-07-08 NOTE — Telephone Encounter (Signed)
Results, ST 

## 2020-07-09 ENCOUNTER — Encounter: Payer: Self-pay | Admitting: Dermatology

## 2020-07-09 NOTE — Progress Notes (Signed)
Chief Complaint:   OBESITY Rebecca Summers is here to discuss her progress with her obesity treatment plan along with follow-up of her obesity related diagnoses. Rebecca Summers is on the Category 3 Plan and states she is following her eating plan approximately 85% of the time. Rebecca Summers states she is walking 15 minutes 4 times per week.  Today's visit was #: 10 Starting weight: 173 lbs Starting date: 03/02/2020 Today's weight: 165 lbs Today's date: 07/07/2020 Total lbs lost to date: 8 lbs Total lbs lost since last in-office visit: 3 lbs  Interim History: Pt is feeling good and more on track. She did more hiking, walked indoors when weather was cold. She did have a few indulgent choices in the past few weeks. She has limited number of times she has eaten out and tries to make more controlled choices when eating out. She is doing cottage cheese, PB with apples, and 100 cal Kind bar or [place] yogurt for snacks.  Subjective:   1. Vitamin D deficiency Pt Vit D level is 26.1. She denies nausea, vomiting, and muscle weakness but notes fatigue. Pt is on prescription Vit D.  2. Insulin resistance Pt has controlled carb indulgences. She is not on medication.  Lab Results  Component Value Date   INSULIN 21.1 03/16/2020   Lab Results  Component Value Date   HGBA1C 5.6 03/16/2020    3. At risk for osteoporosis Rebecca Summers is at higher risk of osteopenia and osteoporosis due to Vitamin D deficiency.   Assessment/Plan:   1. Vitamin D deficiency Low Vitamin D level contributes to fatigue and are associated with obesity, breast, and colon cancer. She agrees to continue to take prescription Vitamin D @50 ,000 IU every week and will follow-up for routine testing of Vitamin D, at least 2-3 times per year to avoid over-replacement.  - Vitamin D, Ergocalciferol, (DRISDOL) 1.25 MG (50000 UNIT) CAPS capsule; Take 1 capsule (50,000 Units total) by mouth every 7 (seven) days.  Dispense: 4 capsule; Refill: 0  2. Insulin  resistance Rebecca Summers will continue to work on weight loss, exercise, and decreasing simple carbohydrates to help decrease the risk of diabetes. Rebecca Summers agreed to follow-up with [place] as directed to closely monitor her progress. Repeat labs in 2 months.  3. At risk for osteoporosis Rebecca Summers was given approximately 15 minutes of osteoporosis prevention counseling today. Rebecca Summers is at risk for osteopenia and osteoporosis due to her Vitamin D deficiency. She was encouraged to take her Vitamin D and follow her higher calcium diet and increase strengthening exercise to help strengthen her bones and decrease her risk of osteopenia and osteoporosis.  Repetitive spaced learning was employed today to elicit superior memory formation and behavioral change.  4. Class 1 obesity with serious comorbidity and body mass index (BMI) of 30.0 to 30.9 in adult, unspecified obesity type Rebecca Summers is currently in the action stage of change. As such, her goal is to continue with weight loss efforts. She has agreed to the Category 3 Plan.   Exercise goals: As is  Behavioral modification strategies: increasing lean protein intake, decreasing eating out, meal planning and cooking strategies and keeping healthy foods in the home.  Rebecca Summers has agreed to follow-up with our clinic in 2 weeks. She was informed of the importance of frequent follow-up visits to maximize her success with intensive lifestyle modifications for her multiple health conditions.   Objective:   Blood pressure 122/67, pulse 77, temperature 98 F (36.7 C), temperature source Oral, height 5\' 4"  (1.626 m),  weight 165 lb (74.8 kg), SpO2 95 %. Body mass index is 28.32 kg/m.  General: Cooperative, alert, well developed, in no acute distress. HEENT: Conjunctivae and lids unremarkable. Cardiovascular: Regular rhythm.  Lungs: Normal work of breathing. Neurologic: No focal deficits.   Lab Results  Component Value Date   CREATININE 0.83 03/16/2020   BUN 15 03/16/2020   NA  140 03/16/2020   K 4.8 03/16/2020   CL 103 03/16/2020   CO2 22 03/16/2020   Lab Results  Component Value Date   ALT 17 03/16/2020   AST 17 03/16/2020   ALKPHOS 71 03/16/2020   BILITOT 0.4 03/16/2020   Lab Results  Component Value Date   HGBA1C 5.6 03/16/2020   Lab Results  Component Value Date   INSULIN 21.1 03/16/2020   Lab Results  Component Value Date   TSH 3.720 03/16/2020   Lab Results  Component Value Date   CHOL 272 (H) 03/16/2020   HDL 43 03/16/2020   LDLCALC 196 (H) 03/16/2020   TRIG 174 (H) 03/16/2020   Lab Results  Component Value Date   WBC 6.0 03/16/2020   HGB 13.3 03/16/2020   HCT 38.4 03/16/2020   MCV 90 03/16/2020   PLT 289 03/16/2020    Attestation Statements:   Reviewed by clinician on day of visit: allergies, medications, problem list, medical history, surgical history, family history, social history, and previous encounter notes.  Coral Ceo, am acting as transcriptionist for Coralie Common, MD.  I have reviewed the above documentation for accuracy and completeness, and I agree with the above. - Jinny Blossom, MD

## 2020-07-09 NOTE — Progress Notes (Signed)
Follow-Up Visit   Subjective  Rebecca Summers is a 60 y.o. female who presents for the following: Annual Exam (Full body skin check. Patient has skin tags on neck she would like removed. Dark spot left side of the jaw x 6 months. Small growth on the right side of her lower back x years, slowly growing, not painful.).  General skin check, possible change in growth right collarbone left inguinal area Location:  Duration:  Quality:  Associated Signs/Symptoms: Modifying Factors:  Severity:  Timing: Context:   Objective  Well appearing patient in no apparent distress; mood and affect are within normal limits. Objective  Head to Toe: Head to toe exam today: No atypical pigmented lesions.  Objective  Right Supraclavicular Area: Waxy pink 6 mm crust, rule out superficial carcinoma       Objective  Right Hip (side) - Posterior: Verrucous polypoid 5 mm papule     Objective  Right Medial Thigh: 2 mm pedunculated papule  Objective  Right Lower Leg - Anterior: Firm 5 mm dermal pink papule  Objective  Chest - Medial (Center), Left Inframammary Fold: Multiple smooth 2 to 3 mm red papules    A full examination was performed including scalp, head, eyes, ears, nose, lips, neck, chest, axillae, abdomen, back, buttocks, bilateral upper extremities, bilateral lower extremities, hands, feet, fingers, toes, fingernails, and toenails. All findings within normal limits unless otherwise noted below.  Areas covered by undergarments not examined.   Assessment & Plan    Skin exam for malignant neoplasm Head to Toe  Annual skin examination, patient encouraged to self examine twice annually.  Continue ultraviolet protection.  Neoplasm of uncertain behavior of skin (2) Right Supraclavicular Area  Skin / nail biopsy Type of biopsy: tangential   Informed consent: discussed and consent obtained   Timeout: patient name, date of birth, surgical site, and procedure verified    Procedure prep:  Patient was prepped and draped in usual sterile fashion (Non sterile) Prep type:  Chlorhexidine Anesthesia: the lesion was anesthetized in a standard fashion   Anesthetic:  1% lidocaine w/ epinephrine 1-100,000 local infiltration Instrument used: flexible razor blade   Hemostasis achieved with: ferric subsulfate   Outcome: patient tolerated procedure well   Post-procedure details: sterile dressing applied and wound care instructions given   Dressing type: bandage and petrolatum    Specimen 1 - Surgical pathology Differential Diagnosis: R/O BCC vs SCC  Check Margins: No  Right Hip (side) - Posterior  Skin / nail biopsy Type of biopsy: tangential   Informed consent: discussed and consent obtained   Timeout: patient name, date of birth, surgical site, and procedure verified   Procedure prep:  Patient was prepped and draped in usual sterile fashion (Non sterile) Prep type:  Chlorhexidine Anesthesia: the lesion was anesthetized in a standard fashion   Anesthetic:  1% lidocaine w/ epinephrine 1-100,000 local infiltration Instrument used: flexible razor blade   Hemostasis achieved with: ferric subsulfate and electrodesiccation   Outcome: patient tolerated procedure well   Post-procedure details: sterile dressing applied and wound care instructions given   Dressing type: bandage and petrolatum    Specimen 2 - Surgical pathology Differential Diagnosis: R/O Tag  Check Margins: No  Cautery after biopsy  Skin tag Right Medial Thigh  No intervention currently necessary  Dermatofibroma Right Lower Leg - Anterior  Recheck as needed change  Hemangioma of skin (2) Chest - Medial (Center); Left Inframammary Fold  No intervention currently necessary      I,  Lavonna Monarch, MD, have reviewed all documentation for this visit.  The documentation on 07/09/20 for the exam, diagnosis, procedures, and orders are all accurate and complete.

## 2020-07-21 ENCOUNTER — Other Ambulatory Visit: Payer: Self-pay

## 2020-07-21 ENCOUNTER — Ambulatory Visit (INDEPENDENT_AMBULATORY_CARE_PROVIDER_SITE_OTHER): Payer: Commercial Managed Care - PPO | Admitting: Family Medicine

## 2020-07-21 ENCOUNTER — Encounter (INDEPENDENT_AMBULATORY_CARE_PROVIDER_SITE_OTHER): Payer: Self-pay | Admitting: Family Medicine

## 2020-07-21 VITALS — BP 137/71 | HR 69 | Temp 98.1°F | Ht 64.0 in | Wt 164.0 lb

## 2020-07-21 DIAGNOSIS — E559 Vitamin D deficiency, unspecified: Secondary | ICD-10-CM

## 2020-07-21 DIAGNOSIS — E669 Obesity, unspecified: Secondary | ICD-10-CM

## 2020-07-21 DIAGNOSIS — E7849 Other hyperlipidemia: Secondary | ICD-10-CM

## 2020-07-21 DIAGNOSIS — Z6830 Body mass index (BMI) 30.0-30.9, adult: Secondary | ICD-10-CM | POA: Diagnosis not present

## 2020-07-22 NOTE — Progress Notes (Signed)
Chief Complaint:   OBESITY Rebecca Summers is here to discuss her progress with her obesity treatment plan along with follow-up of her obesity related diagnoses. Rebecca Summers is on the Category 3 Plan and states she is following her eating plan approximately 60% of the time. Rebecca Summers states she is walking and bike riding 60, 20 minutes 1, 5 times per week.  Today's visit was #: 11 Starting weight: 173 lbs Starting date: 03/02/2020 Today's weight: 164 lbs Today's date: 07/21/2020 Total lbs lost to date: 9 lbs Total lbs lost since last in-office visit: 1 lb  Interim History: The last few weeks, Rebecca Summers had a death ion her family (ex father-in-law), and she had a stressful week (+). She did have sone struggle with lack of planning and is experiencing some sweets cravings. She hasn't really started weight training due to not being very learned in resistance training. She wants to continue category 3 plan.  Subjective:   1. Other hyperlipidemia Rebecca Summers's last LDL 196, HDL 43, and triglycerides 174. She is not on statin therapy.  2. Vitamin D deficiency Rebecca Summers's last Vit D level was 26.1. Pt denies nausea, vomiting, and muscle weakness but notes fatigue. Pt is on prescription Vit D.  Assessment/Plan:   1. Other hyperlipidemia Cardiovascular risk and specific lipid/LDL goals reviewed.  We discussed several lifestyle modifications today and Rebecca Summers will continue to work on diet, exercise and weight loss efforts. Orders and follow up as documented in patient record. Repeat labs at next appointment.  Counseling Intensive lifestyle modifications are the first line treatment for this issue. . Dietary changes: Increase soluble fiber. Decrease simple carbohydrates. . Exercise changes: Moderate to vigorous-intensity aerobic activity 150 minutes per week if tolerated. . Lipid-lowering medications: see documented in medical record.  2. Vitamin D deficiency Low Vitamin D level contributes to fatigue and are associated  with obesity, breast, and colon cancer. She agrees to continue to take prescription Vitamin D @50 ,000 IU every week and will follow-up for routine testing of Vitamin D, at least 2-3 times per year to avoid over-replacement. Repeat labs at next appointment.   3. Class 1 obesity with serious comorbidity and body mass index (BMI) of 30.0 to 30.9 in adult, unspecified obesity type Rebecca Summers is currently in the action stage of change. As such, her goal is to continue with weight loss efforts. She has agreed to the Category 3 Plan.   Exercise goals: All adults should avoid inactivity. Some physical activity is better than none, and adults who participate in any amount of physical activity gain some health benefits. Start resistance training.  Behavioral modification strategies: increasing lean protein intake, meal planning and cooking strategies, keeping healthy foods in the home and planning for success.  Rebecca Summers has agreed to follow-up with our clinic in 3 weeks. She was informed of the importance of frequent follow-up visits to maximize her success with intensive lifestyle modifications for her multiple health conditions.   Objective:   Blood pressure 137/71, pulse 69, temperature 98.1 F (36.7 C), temperature source Oral, height 5\' 4"  (1.626 m), weight 164 lb (74.4 kg), SpO2 98 %. Body mass index is 28.15 kg/m.  General: Cooperative, alert, well developed, in no acute distress. HEENT: Conjunctivae and lids unremarkable. Cardiovascular: Regular rhythm.  Lungs: Normal work of breathing. Neurologic: No focal deficits.   Lab Results  Component Value Date   CREATININE 0.83 03/16/2020   BUN 15 03/16/2020   NA 140 03/16/2020   K 4.8 03/16/2020   CL 103 03/16/2020  CO2 22 03/16/2020   Lab Results  Component Value Date   ALT 17 03/16/2020   AST 17 03/16/2020   ALKPHOS 71 03/16/2020   BILITOT 0.4 03/16/2020   Lab Results  Component Value Date   HGBA1C 5.6 03/16/2020   Lab Results   Component Value Date   INSULIN 21.1 03/16/2020   Lab Results  Component Value Date   TSH 3.720 03/16/2020   Lab Results  Component Value Date   CHOL 272 (H) 03/16/2020   HDL 43 03/16/2020   LDLCALC 196 (H) 03/16/2020   TRIG 174 (H) 03/16/2020   Lab Results  Component Value Date   WBC 6.0 03/16/2020   HGB 13.3 03/16/2020   HCT 38.4 03/16/2020   MCV 90 03/16/2020   PLT 289 03/16/2020   Attestation Statements:   Reviewed by clinician on day of visit: allergies, medications, problem list, medical history, surgical history, family history, social history, and previous encounter notes.  Time spent on visit including pre-visit chart review and post-visit care and charting was 15 minutes.   Coral Ceo, am acting as transcriptionist for Coralie Common, MD.   I have reviewed the above documentation for accuracy and completeness, and I agree with the above. - Jinny Blossom, MD

## 2020-08-10 ENCOUNTER — Encounter (INDEPENDENT_AMBULATORY_CARE_PROVIDER_SITE_OTHER): Payer: Self-pay | Admitting: Family Medicine

## 2020-08-10 ENCOUNTER — Telehealth (INDEPENDENT_AMBULATORY_CARE_PROVIDER_SITE_OTHER): Payer: Commercial Managed Care - PPO | Admitting: Family Medicine

## 2020-08-10 ENCOUNTER — Other Ambulatory Visit (INDEPENDENT_AMBULATORY_CARE_PROVIDER_SITE_OTHER): Payer: Self-pay | Admitting: Family Medicine

## 2020-08-10 ENCOUNTER — Other Ambulatory Visit: Payer: Self-pay

## 2020-08-10 DIAGNOSIS — J301 Allergic rhinitis due to pollen: Secondary | ICD-10-CM | POA: Diagnosis not present

## 2020-08-10 DIAGNOSIS — E559 Vitamin D deficiency, unspecified: Secondary | ICD-10-CM | POA: Diagnosis not present

## 2020-08-10 DIAGNOSIS — Z6830 Body mass index (BMI) 30.0-30.9, adult: Secondary | ICD-10-CM | POA: Diagnosis not present

## 2020-08-10 DIAGNOSIS — E669 Obesity, unspecified: Secondary | ICD-10-CM

## 2020-08-10 MED ORDER — VITAMIN D (ERGOCALCIFEROL) 1.25 MG (50000 UNIT) PO CAPS: 50000.0000 [IU] | ORAL_CAPSULE | ORAL | 0 refills | Status: DC

## 2020-08-10 NOTE — Telephone Encounter (Signed)
Dr.Ukleja 

## 2020-08-13 NOTE — Progress Notes (Signed)
TeleHealth Visit:  Due to the COVID-19 pandemic, this visit was completed with telemedicine (audio/video) technology to reduce patient and provider exposure as well as to preserve personal protective equipment.   Rebecca Summers has verbally consented to this TeleHealth visit. The patient is located at home, the provider is located at the [facility] office. The participants in this visit include the listed provider and patient. The visit was conducted today via video.   Chief Complaint: OBESITY Rebecca Summers is here to discuss her progress with her obesity treatment plan along with follow-up of her obesity related diagnoses. Rebecca Summers is on the Category 3 Plan and states she is following her eating plan approximately 60% of the time. Rebecca Summers states she is walking 20 minutes 3 times per week.  Today's visit was #: 12 Starting weight: 173 lbs Starting date: 03/02/2020  Interim History: Last few weeks, Rebecca Summers has been moderately adherent to meal plan. She had some birthday parties/excursions and therefore indulgent eating. She has not done as much activity as she would have liked. She has upcoming camping trip that will allow pt to increase activity. Pt is having sugar cravings and only doing 100 cal Kind Bars.  Subjective:   1. Vitamin D deficiency Rebecca Summers denies nausea, vomiting, and muscle weakness but notes fatigue. Pt is on prescription Vit D. Her last Vit D level was 26.1.  2. Seasonal allergic rhinitis due to pollen Rebecca Summers went to get Zyrtec, Flonase, and Sudafed. She reports congestion and chest congestion.  Assessment/Plan:   1. Vitamin D deficiency Low Vitamin D level contributes to fatigue and are associated with obesity, breast, and colon cancer. She agrees to continue to take prescription Vitamin D @50 ,000 IU every week and will follow-up for routine testing of Vitamin D, at least 2-3 times per year to avoid over-replacement.  - Vitamin D, Ergocalciferol, (DRISDOL) 1.25 MG (50000  UNIT) CAPS capsule; Take 1 capsule (50,000 Units total) by mouth every 7 (seven) days.  Dispense: 4 capsule; Refill: 0  2. Seasonal allergic rhinitis due to pollen Continue current OTC follow up at next appointment.  3. Class 1 obesity with serious comorbidity and body mass index (BMI) of 30.0 to 30.9 in adult, unspecified obesity type Rebecca Summers is currently in the action stage of change. As such, her goal is to continue with weight loss efforts. She has agreed to the Category 3 Plan.   Exercise goals: All adults should avoid inactivity. Some physical activity is better than none, and adults who participate in any amount of physical activity gain some health benefits.  Behavioral modification strategies: increasing lean protein intake, meal planning and cooking strategies, keeping healthy foods in the home and planning for success.  Rebecca Summers has agreed to follow-up with our clinic in 3 weeks. She was informed of the importance of frequent follow-up visits to maximize her success with intensive lifestyle modifications for her multiple health conditions.  Objective:   VITALS: Per patient if applicable, see vitals. GENERAL: Alert and in no acute distress. CARDIOPULMONARY: No increased WOB. Speaking in clear sentences.  PSYCH: Pleasant and cooperative. Speech normal rate and rhythm. Affect is appropriate. Insight and judgement are appropriate. Attention is focused, linear, and appropriate.  NEURO: Oriented as arrived to appointment on time with no prompting.   Lab Results  Component Value Date   CREATININE 0.83 03/16/2020   BUN 15 03/16/2020   NA 140 03/16/2020   K 4.8 03/16/2020   CL 103 03/16/2020   CO2 22 03/16/2020  Lab Results  Component Value Date   ALT 17 03/16/2020   AST 17 03/16/2020   ALKPHOS 71 03/16/2020   BILITOT 0.4 03/16/2020   Lab Results  Component Value Date   HGBA1C 5.6 03/16/2020   Lab Results  Component Value Date   INSULIN 21.1 03/16/2020   Lab Results   Component Value Date   TSH 3.720 03/16/2020   Lab Results  Component Value Date   CHOL 272 (H) 03/16/2020   HDL 43 03/16/2020   LDLCALC 196 (H) 03/16/2020   TRIG 174 (H) 03/16/2020   Lab Results  Component Value Date   WBC 6.0 03/16/2020   HGB 13.3 03/16/2020   HCT 38.4 03/16/2020   MCV 90 03/16/2020   PLT 289 03/16/2020   No results found for: IRON, TIBC, FERRITIN  Attestation Statements:   Reviewed by clinician on day of visit: allergies, medications, problem list, medical history, surgical history, family history, social history, and previous encounter notes.  Coral Ceo, am acting as transcriptionist for Coralie Common, MD.   I have reviewed the above documentation for accuracy and completeness, and I agree with the above. - Jinny Blossom, MD

## 2020-09-01 ENCOUNTER — Other Ambulatory Visit: Payer: Self-pay

## 2020-09-01 ENCOUNTER — Ambulatory Visit (INDEPENDENT_AMBULATORY_CARE_PROVIDER_SITE_OTHER): Payer: Commercial Managed Care - PPO | Admitting: Family Medicine

## 2020-09-01 ENCOUNTER — Encounter (INDEPENDENT_AMBULATORY_CARE_PROVIDER_SITE_OTHER): Payer: Self-pay | Admitting: Family Medicine

## 2020-09-01 VITALS — BP 107/65 | HR 60 | Temp 97.8°F | Ht 64.0 in | Wt 162.0 lb

## 2020-09-01 DIAGNOSIS — E8881 Metabolic syndrome: Secondary | ICD-10-CM | POA: Diagnosis not present

## 2020-09-01 DIAGNOSIS — Z9189 Other specified personal risk factors, not elsewhere classified: Secondary | ICD-10-CM

## 2020-09-01 DIAGNOSIS — E669 Obesity, unspecified: Secondary | ICD-10-CM

## 2020-09-01 DIAGNOSIS — Z6830 Body mass index (BMI) 30.0-30.9, adult: Secondary | ICD-10-CM

## 2020-09-01 DIAGNOSIS — E7849 Other hyperlipidemia: Secondary | ICD-10-CM

## 2020-09-01 DIAGNOSIS — E559 Vitamin D deficiency, unspecified: Secondary | ICD-10-CM | POA: Diagnosis not present

## 2020-09-02 LAB — LIPID PANEL WITH LDL/HDL RATIO
Cholesterol, Total: 241 mg/dL — ABNORMAL HIGH (ref 100–199)
HDL: 47 mg/dL (ref >39)
LDL Chol Calc (NIH): 168 mg/dL — ABNORMAL HIGH (ref 0–99)
LDL/HDL Ratio: 3.6 ratio — ABNORMAL HIGH (ref 0.0–3.2)
Triglycerides: 143 mg/dL (ref 0–149)
VLDL Cholesterol Cal: 26 mg/dL (ref 5–40)

## 2020-09-02 LAB — HEMOGLOBIN A1C
Est. average glucose Bld gHb Est-mCnc: 111 mg/dL
Hgb A1c MFr Bld: 5.5 % (ref 4.8–5.6)

## 2020-09-02 LAB — VITAMIN D 25 HYDROXY (VIT D DEFICIENCY, FRACTURES): Vit D, 25-Hydroxy: 54.7 ng/mL (ref 30.0–100.0)

## 2020-09-02 LAB — INSULIN, RANDOM: INSULIN: 11.5 u[IU]/mL (ref 2.6–24.9)

## 2020-09-02 NOTE — Progress Notes (Signed)
Chief Complaint:   OBESITY Rebecca Summers is here to discuss her progress with her obesity treatment plan along with follow-up of her obesity related diagnoses. Rebecca Summers is on the Category 3 Plan and states she is following her eating plan approximately 75% of the time. Rebecca Summers states she is walking, biking, and strengthening 30-60 minutes 2-3 times per week.  Today's visit was #: 13 Starting weight: 173 lbs Starting date: 03/02/2020 Today's weight: 162 lbs Today's date: 09/01/2020 Total lbs lost to date: 11 Total lbs lost since last in-office visit: 2  Interim History: Rebecca Summers has had a few deaths int he past few weeks. She has had increased stress with new boss at work. She has started weight bearing exercise. Pt has tried to follow plan as much as she can. 2 camping trips coming up. Breakfast is 100% compliance, 70% on lunch, and 80% dinner. She is trying to be mindful when eating out at lunch.  Subjective:   1. Other hyperlipidemia Last LDL 196, HDL 43, and triglycerides 174. Rebecca Summers is not on statin therapy.  2. Vitamin D deficiency Pt denies nausea, vomiting, and muscle weakness but notes fatigue. Pt is on prescription Vit D.  3. Insulin resistance Last A1c 5.6 and insulin level 21.1. Rebecca Summers is not on medication.  4. At risk for heart disease Rebecca Summers is at a higher than average risk for cardiovascular disease due to obesity.   Assessment/Plan:   1. Other hyperlipidemia Cardiovascular risk and specific lipid/LDL goals reviewed.  We discussed several lifestyle modifications today and [place] will continue to work on diet, exercise and weight loss efforts. Orders and follow up as documented in patient record. Check labs today.  Counseling Intensive lifestyle modifications are the first line treatment for this issue. . Dietary changes: Increase soluble fiber. Decrease simple carbohydrates. . Exercise changes: Moderate to vigorous-intensity aerobic activity 150 minutes per week if  tolerated. . Lipid-lowering medications: see documented in medical record.  - Lipid Panel With LDL/HDL Ratio  2. Vitamin D deficiency Low Vitamin D level contributes to fatigue and are associated with obesity, breast, and colon cancer. She agrees to continue to take prescription Vitamin D @50 ,000 IU every week and will follow-up for routine testing of Vitamin D, at least 2-3 times per year to avoid over-replacement. Check labs today.  - VITAMIN D 25 Hydroxy (Vit-D Deficiency, Fractures)  3. Insulin resistance Rebecca Summers will continue to work on weight loss, exercise, and decreasing simple carbohydrates to help decrease the risk of diabetes. Rebecca Summers agreed to follow-up with [place] as directed to closely monitor her progress. Check labs today.  - Hemoglobin A1c - Insulin, random  4. At risk for heart disease Rebecca Summers was given approximately 15 minutes of coronary artery disease prevention counseling today. She is 60 y.o. female and has risk factors for heart disease including obesity. We discussed intensive lifestyle modifications today with an emphasis on specific weight loss instructions and strategies.   Repetitive spaced learning was employed today to elicit superior memory formation and behavioral change.  5. Class 1 obesity with serious comorbidity and body mass index (BMI) of 30.0 to 30.9 in adult, unspecified obesity type  Rebecca Summers is currently in the action stage of change. As such, her goal is to continue with weight loss efforts. She has agreed to the Category 3 Plan and keeping a food journal and adhering to recommended goals of 350-500 calories and 30+ grams protein at lunch.   Exercise goals: As is  Behavioral modification strategies: increasing lean  protein intake, meal planning and cooking strategies and keeping healthy foods in the home.  Rebecca Summers has agreed to follow-up with our clinic in 2-3 weeks. She was informed of the importance of frequent follow-up visits to maximize her success with  intensive lifestyle modifications for her multiple health conditions.   Rebecca Summers was informed we would discuss her lab results at her next visit unless there is a critical issue that needs to be addressed sooner. Rebecca Summers agreed to keep her next visit at the agreed upon time to discuss these results.  Objective:   Blood pressure 107/65, pulse 60, temperature 97.8 F (36.6 C), height 5\' 4"  (1.626 m), weight 162 lb (73.5 kg), SpO2 99 %. Body mass index is 27.81 kg/m.  General: Cooperative, alert, well developed, in no acute distress. HEENT: Conjunctivae and lids unremarkable. Cardiovascular: Regular rhythm.  Lungs: Normal work of breathing. Neurologic: No focal deficits.   Lab Results  Component Value Date   CREATININE 0.83 03/16/2020   BUN 15 03/16/2020   NA 140 03/16/2020   K 4.8 03/16/2020   CL 103 03/16/2020   CO2 22 03/16/2020   Lab Results  Component Value Date   ALT 17 03/16/2020   AST 17 03/16/2020   ALKPHOS 71 03/16/2020   BILITOT 0.4 03/16/2020   Lab Results  Component Value Date   HGBA1C 5.5 09/01/2020   HGBA1C 5.6 03/16/2020   Lab Results  Component Value Date   INSULIN 11.5 09/01/2020   INSULIN 21.1 03/16/2020   Lab Results  Component Value Date   TSH 3.720 03/16/2020   Lab Results  Component Value Date   CHOL 241 (H) 09/01/2020   HDL 47 09/01/2020   LDLCALC 168 (H) 09/01/2020   TRIG 143 09/01/2020   Lab Results  Component Value Date   WBC 6.0 03/16/2020   HGB 13.3 03/16/2020   HCT 38.4 03/16/2020   MCV 90 03/16/2020   PLT 289 03/16/2020    Attestation Statements:   Reviewed by clinician on day of visit: allergies, medications, problem list, medical history, surgical history, family history, social history, and previous encounter notes.  Time spent on visit including pre-visit chart review and post-visit care and charting was 15 minutes.   Coral Ceo, CMA, am acting as transcriptionist for Coralie Common, MD.   I have reviewed the  above documentation for accuracy and completeness, and I agree with the above. - Jinny Blossom, MD

## 2020-09-21 ENCOUNTER — Other Ambulatory Visit (INDEPENDENT_AMBULATORY_CARE_PROVIDER_SITE_OTHER): Payer: Self-pay | Admitting: Family Medicine

## 2020-09-21 DIAGNOSIS — E559 Vitamin D deficiency, unspecified: Secondary | ICD-10-CM

## 2020-09-23 ENCOUNTER — Encounter (INDEPENDENT_AMBULATORY_CARE_PROVIDER_SITE_OTHER): Payer: Self-pay | Admitting: Family Medicine

## 2020-09-23 ENCOUNTER — Other Ambulatory Visit: Payer: Self-pay

## 2020-09-23 ENCOUNTER — Ambulatory Visit (INDEPENDENT_AMBULATORY_CARE_PROVIDER_SITE_OTHER): Payer: Commercial Managed Care - PPO | Admitting: Family Medicine

## 2020-09-23 VITALS — BP 95/60 | HR 58 | Temp 97.9°F | Ht 64.0 in | Wt 160.0 lb

## 2020-09-23 DIAGNOSIS — E7849 Other hyperlipidemia: Secondary | ICD-10-CM

## 2020-09-23 DIAGNOSIS — E559 Vitamin D deficiency, unspecified: Secondary | ICD-10-CM | POA: Diagnosis not present

## 2020-09-23 DIAGNOSIS — E669 Obesity, unspecified: Secondary | ICD-10-CM | POA: Diagnosis not present

## 2020-09-23 DIAGNOSIS — Z9189 Other specified personal risk factors, not elsewhere classified: Secondary | ICD-10-CM

## 2020-09-23 DIAGNOSIS — Z6830 Body mass index (BMI) 30.0-30.9, adult: Secondary | ICD-10-CM

## 2020-09-23 DIAGNOSIS — E8881 Metabolic syndrome: Secondary | ICD-10-CM | POA: Diagnosis not present

## 2020-09-23 MED ORDER — VITAMIN D (ERGOCALCIFEROL) 1.25 MG (50000 UNIT) PO CAPS: 50000.0000 [IU] | ORAL_CAPSULE | ORAL | 0 refills | Status: DC

## 2020-09-29 NOTE — Progress Notes (Signed)
Chief Complaint:   OBESITY Rebecca Summers is here to discuss her progress with her obesity treatment plan along with follow-up of her obesity related diagnoses. Rebecca Summers is on the Category 3 Plan and keeping a food journal and adhering to recommended goals of 350-500 calories and 30 grams protein with lunch and states she is following her eating plan approximately 75% of the time. Rebecca Summers states she is walking and hiking 40 minutes 3 times per week.  Today's visit was #: 14 Starting weight: 173 lbs Starting date: 03/02/2020 Today's weight: 160 lbs Today's date: 09/23/2020 Total lbs lost to date: 13 lbs Total lbs lost since last in-office visit: 2  Interim History: Rebecca Summers is doing really well on breakfast and lunch. She is still getting hungry between lunch and dinner and has had a couple of indulgences. She has had 2 weekends of camping where she does eat what she wants. She is going to [place], [place] for her birthday. She just wants to continue doing what she is doing meal plan wise.  Subjective:   1. Vitamin D deficiency Discussed labs with patient today. Rebecca Summers denies nausea, vomiting, and muscle weakness but notes fatigue. Pt is on prescription Vit D. Her Vit D level is 54.7.  2. Other hyperlipidemia Last LDL 168, HDL 47, and triglycerides 143. Rebecca Summers is not on statin therapy.  3. Insulin resistance A1c 5.5 and insulin level 11.5. Rebecca Summers is not on medication. Labs have improved from previous valves.   4. At risk for osteoporosis Rebecca Summers is at higher risk of osteopenia and osteoporosis due to Vitamin D deficiency.   Assessment/Plan:   1. Vitamin D deficiency Low Vitamin D level contributes to fatigue and are associated with obesity, breast, and colon cancer. She agrees to change to taking prescription Vitamin D @50 ,000 IU every 2 weeks and will follow-up for routine testing of Vitamin D, at least 2-3 times per year to avoid over-replacement.  - Vitamin D, Ergocalciferol, (DRISDOL) 1.25 MG (50000  UNIT) CAPS capsule; Take 1 capsule (50,000 Units total) by mouth every 14 (fourteen) days.  Dispense: 8 capsule; Refill: 0  2. Other hyperlipidemia Cardiovascular risk and specific lipid/LDL goals reviewed.  We discussed several lifestyle modifications today and [place] will continue to work on diet, exercise and weight loss efforts. Orders and follow up as documented in patient record.  -Repeat labs in 3 months.  Counseling Intensive lifestyle modifications are the first line treatment for this issue. Dietary changes: Increase soluble fiber. Decrease simple carbohydrates. Exercise changes: Moderate to vigorous-intensity aerobic activity 150 minutes per week if tolerated. Lipid-lowering medications: see documented in medical record.  3. Insulin resistance Rebecca Summers will continue to work on weight loss, exercise, and decreasing simple carbohydrates to help decrease the risk of diabetes. Rebecca Summers agreed to follow-up with [place] as directed to closely monitor her progress. -Continue category 3. Repeat labs in 3 months.  4. At risk for osteoporosis Rebecca Summers was given approximately 15 minutes of osteoporosis prevention counseling today. Dajsha is at risk for osteopenia and osteoporosis due to her Vitamin D deficiency. She was encouraged to take her Vitamin D and follow her higher calcium diet and increase strengthening exercise to help strengthen her bones and decrease her risk of osteopenia and osteoporosis.  Repetitive spaced learning was employed today to elicit superior memory formation and behavioral change.  5. Class 1 obesity with serious comorbidity and body mass index (BMI) of 30.0 to 30.9 in adult, unspecified obesity type Rebecca Summers is currently in the action stage  of change. As such, her goal is to continue with weight loss efforts. She has agreed to the Category 3 Plan.   Exercise goals: All adults should avoid inactivity. Some physical activity is better than none, and adults who participate in any amount  of physical activity gain some health benefits.  Behavioral modification strategies: increasing lean protein intake, meal planning and cooking strategies, keeping healthy foods in the home and planning for success.  Rebecca Summers has agreed to follow-up with our clinic in 3-4 weeks. She was informed of the importance of frequent follow-up visits to maximize her success with intensive lifestyle modifications for her multiple health conditions.   Objective:   Blood pressure 95/60, pulse (!) 58, temperature 97.9 F (36.6 C), height 5\' 4"  (1.626 m), weight 160 lb (72.6 kg), SpO2 99 %. Body mass index is 27.46 kg/m.  General: Cooperative, alert, well developed, in no acute distress. HEENT: Conjunctivae and lids unremarkable. Cardiovascular: Regular rhythm.  Lungs: Normal work of breathing. Neurologic: No focal deficits.   Lab Results  Component Value Date   CREATININE 0.83 03/16/2020   BUN 15 03/16/2020   NA 140 03/16/2020   K 4.8 03/16/2020   CL 103 03/16/2020   CO2 22 03/16/2020   Lab Results  Component Value Date   ALT 17 03/16/2020   AST 17 03/16/2020   ALKPHOS 71 03/16/2020   BILITOT 0.4 03/16/2020   Lab Results  Component Value Date   HGBA1C 5.5 09/01/2020   HGBA1C 5.6 03/16/2020   Lab Results  Component Value Date   INSULIN 11.5 09/01/2020   INSULIN 21.1 03/16/2020   Lab Results  Component Value Date   TSH 3.720 03/16/2020   Lab Results  Component Value Date   CHOL 241 (H) 09/01/2020   HDL 47 09/01/2020   LDLCALC 168 (H) 09/01/2020   TRIG 143 09/01/2020   Lab Results  Component Value Date   WBC 6.0 03/16/2020   HGB 13.3 03/16/2020   HCT 38.4 03/16/2020   MCV 90 03/16/2020   PLT 289 03/16/2020   No results found for: IRON, TIBC, FERRITIN   Attestation Statements:   Reviewed by clinician on day of visit: allergies, medications, problem list, medical history, surgical history, family history, social history, and previous encounter notes.  Coral Ceo, CMA, am acting as transcriptionist for Coralie Common, MD.   I have reviewed the above documentation for accuracy and completeness, and I agree with the above. - Jinny Blossom, MD

## 2020-10-21 ENCOUNTER — Ambulatory Visit (INDEPENDENT_AMBULATORY_CARE_PROVIDER_SITE_OTHER): Payer: Commercial Managed Care - PPO | Admitting: Family Medicine

## 2020-10-22 ENCOUNTER — Ambulatory Visit (INDEPENDENT_AMBULATORY_CARE_PROVIDER_SITE_OTHER): Payer: Commercial Managed Care - PPO | Admitting: Family Medicine

## 2020-10-22 ENCOUNTER — Other Ambulatory Visit: Payer: Self-pay

## 2020-10-22 ENCOUNTER — Encounter (INDEPENDENT_AMBULATORY_CARE_PROVIDER_SITE_OTHER): Payer: Self-pay | Admitting: Family Medicine

## 2020-10-22 VITALS — BP 112/66 | HR 65 | Temp 98.5°F | Ht 64.0 in | Wt 163.0 lb

## 2020-10-22 DIAGNOSIS — Z6830 Body mass index (BMI) 30.0-30.9, adult: Secondary | ICD-10-CM | POA: Diagnosis not present

## 2020-10-22 DIAGNOSIS — E559 Vitamin D deficiency, unspecified: Secondary | ICD-10-CM | POA: Diagnosis not present

## 2020-10-22 DIAGNOSIS — E669 Obesity, unspecified: Secondary | ICD-10-CM | POA: Diagnosis not present

## 2020-10-22 DIAGNOSIS — Z9189 Other specified personal risk factors, not elsewhere classified: Secondary | ICD-10-CM | POA: Diagnosis not present

## 2020-10-22 DIAGNOSIS — E7849 Other hyperlipidemia: Secondary | ICD-10-CM

## 2020-10-22 MED ORDER — VITAMIN D (ERGOCALCIFEROL) 1.25 MG (50000 UNIT) PO CAPS: 50000.0000 [IU] | ORAL_CAPSULE | ORAL | 0 refills | Status: AC

## 2020-10-27 NOTE — Progress Notes (Signed)
Chief Complaint:   OBESITY Rebecca Summers is here to discuss her progress with her obesity treatment plan along with follow-up of her obesity related diagnoses. Rebecca Summers is on the Category 3 Plan and states she is following her eating plan approximately 70% of the time. Rebecca Summers states she is water skiing 30 minutes.  Today's visit was #: 15 Starting weight: 173 lbs Starting date: 03/02/2020 Today's weight: 163 lbs Today's date: 10/22/2020 Total lbs lost to date: 10 Total lbs lost since last in-office visit: 0  Interim History: Rebecca Summers has increased cravings for ice cream 2-3 times a week. She has been on the go more than normal. Breakfast is on plan. Lunch is hit or miss and dinner is take out more common. She also has gone out to celebrate her birthday. The next few weeks fairly similar. She realizes she needs to plan better. She has been doing more water skiing recently.  Subjective:   1. Vitamin D deficiency Rebecca Summers is on prescription Vit D. Her last Vit D level was 54.7.  2. Other hyperlipidemia Rebecca Summers's LDL is 168, triglycerides 143, and HDL 47. She is not on statin therapy.  3. At risk for osteoporosis Rebecca Summers is at higher risk of osteopenia and osteoporosis due to Vitamin D deficiency.   Assessment/Plan:   1. Vitamin D deficiency Low Vitamin D level contributes to fatigue and are associated with obesity, breast, and colon cancer. She agrees to continue to take prescription Vitamin D @50 ,000 IU every 14 days and will follow-up for routine testing of Vitamin D, at least 2-3 times per year to avoid over-replacement.  Refill- Vitamin D, Ergocalciferol, (DRISDOL) 1.25 MG (50000 UNIT) CAPS capsule; Take 1 capsule (50,000 Units total) by mouth every 14 (fourteen) days.  Dispense: 8 capsule; Refill: 0  2. Other hyperlipidemia Cardiovascular risk and specific lipid/LDL goals reviewed.  We discussed several lifestyle modifications today and Rebecca Summers will continue to work on diet, exercise and weight  loss efforts. Orders and follow up as documented in patient record. Repeat labs in October.  Counseling Intensive lifestyle modifications are the first line treatment for this issue. Dietary changes: Increase soluble fiber. Decrease simple carbohydrates. Exercise changes: Moderate to vigorous-intensity aerobic activity 150 minutes per week if tolerated. Lipid-lowering medications: see documented in medical record.  3. At risk for osteoporosis Rebecca Summers was given approximately 15 minutes of osteoporosis prevention counseling today. Rebecca Summers is at risk for osteopenia and osteoporosis due to her Vitamin D deficiency. She was encouraged to take her Vitamin D and follow her higher calcium diet and increase strengthening exercise to help strengthen her bones and decrease her risk of osteopenia and osteoporosis.  Repetitive spaced learning was employed today to elicit superior memory formation and behavioral change.  4. Class 1 obesity with serious comorbidity and body mass index (BMI) of 30.0 to 30.9 in adult, unspecified obesity type  Rebecca Summers is currently in the action stage of change. As such, her goal is to continue with weight loss efforts. She has agreed to the Category 3 Plan.   Exercise goals: All adults should avoid inactivity. Some physical activity is better than none, and adults who participate in any amount of physical activity gain some health benefits.  Behavioral modification strategies: increasing lean protein intake, meal planning and cooking strategies, keeping healthy foods in the home, and planning for success.  Rebecca Summers has agreed to follow-up with our clinic in 4 weeks. She was informed of the importance of frequent follow-up visits to maximize her success with  intensive lifestyle modifications for her multiple health conditions.   Objective:   Blood pressure 112/66, pulse 65, temperature 98.5 F (36.9 C), height 5\' 4"  (1.626 m), weight 163 lb (73.9 kg), SpO2 98 %. Body mass index is  27.98 kg/m.  General: Cooperative, alert, well developed, in no acute distress. HEENT: Conjunctivae and lids unremarkable. Cardiovascular: Regular rhythm.  Lungs: Normal work of breathing. Neurologic: No focal deficits.   Lab Results  Component Value Date   CREATININE 0.83 03/16/2020   BUN 15 03/16/2020   NA 140 03/16/2020   K 4.8 03/16/2020   CL 103 03/16/2020   CO2 22 03/16/2020   Lab Results  Component Value Date   ALT 17 03/16/2020   AST 17 03/16/2020   ALKPHOS 71 03/16/2020   BILITOT 0.4 03/16/2020   Lab Results  Component Value Date   HGBA1C 5.5 09/01/2020   HGBA1C 5.6 03/16/2020   Lab Results  Component Value Date   INSULIN 11.5 09/01/2020   INSULIN 21.1 03/16/2020   Lab Results  Component Value Date   TSH 3.720 03/16/2020   Lab Results  Component Value Date   CHOL 241 (H) 09/01/2020   HDL 47 09/01/2020   LDLCALC 168 (H) 09/01/2020   TRIG 143 09/01/2020   Lab Results  Component Value Date   WBC 6.0 03/16/2020   HGB 13.3 03/16/2020   HCT 38.4 03/16/2020   MCV 90 03/16/2020   PLT 289 03/16/2020    Attestation Statements:   Reviewed by clinician on day of visit: allergies, medications, problem list, medical history, surgical history, family history, social history, and previous encounter notes.  Coral Ceo, CMA, am acting as transcriptionist for Coralie Common, MD.   I have reviewed the above documentation for accuracy and completeness, and I agree with the above. - Jinny Blossom, MD

## 2020-11-19 ENCOUNTER — Ambulatory Visit (INDEPENDENT_AMBULATORY_CARE_PROVIDER_SITE_OTHER): Payer: Commercial Managed Care - PPO | Admitting: Family Medicine

## 2020-12-02 ENCOUNTER — Encounter (INDEPENDENT_AMBULATORY_CARE_PROVIDER_SITE_OTHER): Payer: Self-pay | Admitting: Family Medicine

## 2020-12-02 NOTE — Telephone Encounter (Signed)
Please advise 

## 2020-12-07 ENCOUNTER — Ambulatory Visit (INDEPENDENT_AMBULATORY_CARE_PROVIDER_SITE_OTHER): Payer: Commercial Managed Care - PPO | Admitting: Family Medicine

## 2021-03-27 IMAGING — MG DIGITAL SCREENING BILAT W/ TOMO W/ CAD
8 series · 8 of 24 positions shown · non-contrast
Comparison: Previous exam(s).

CLINICAL DATA: Screening.

EXAM:
DIGITAL SCREENING BILATERAL MAMMOGRAM WITH TOMO AND CAD

[R MLO synth-2D]
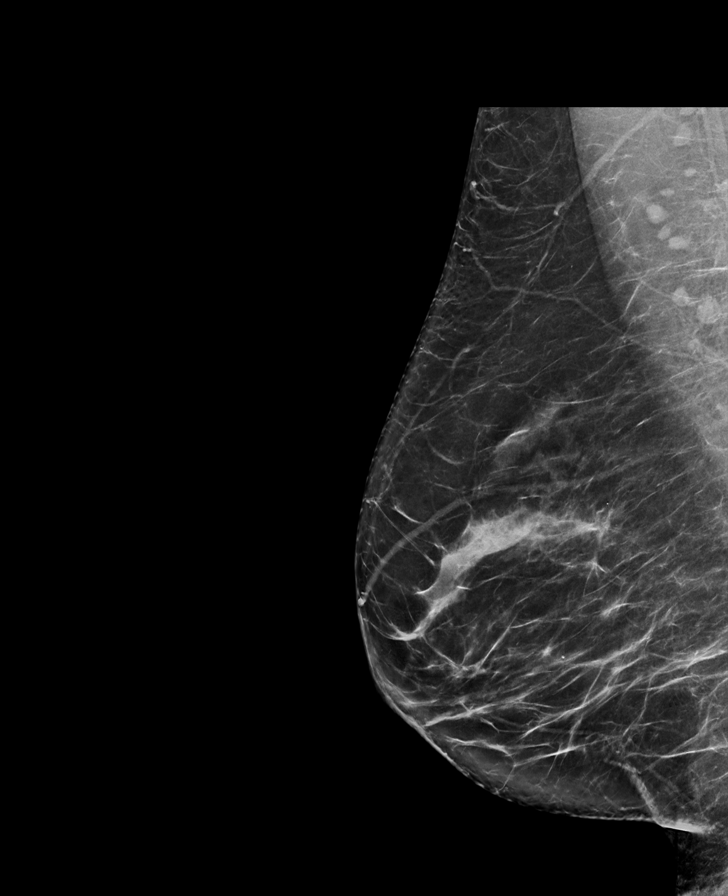

[L MLO synth-2D]
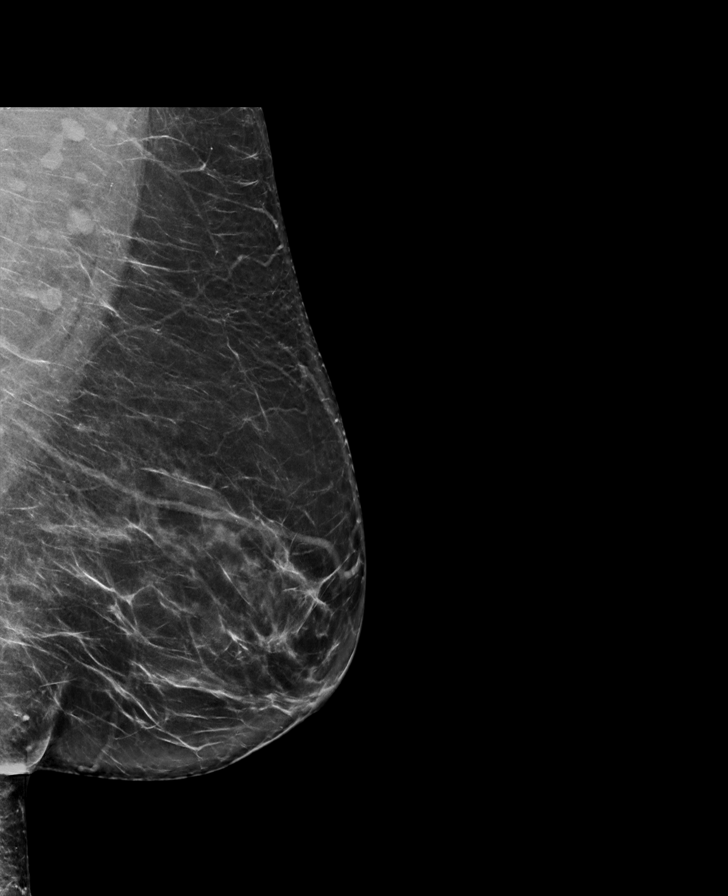

[R CC synth-2D]
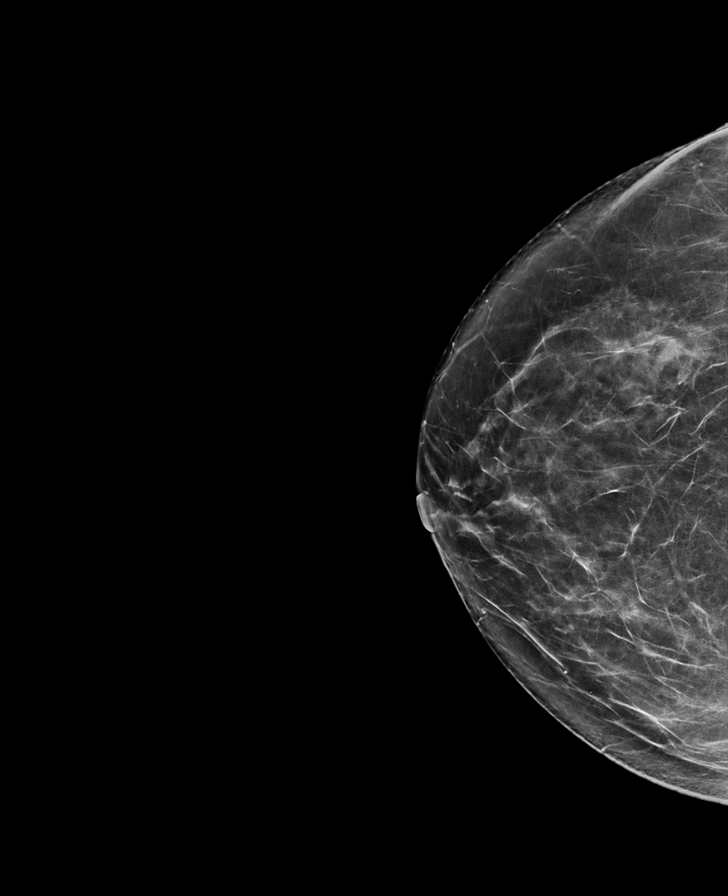

[L CC synth-2D]
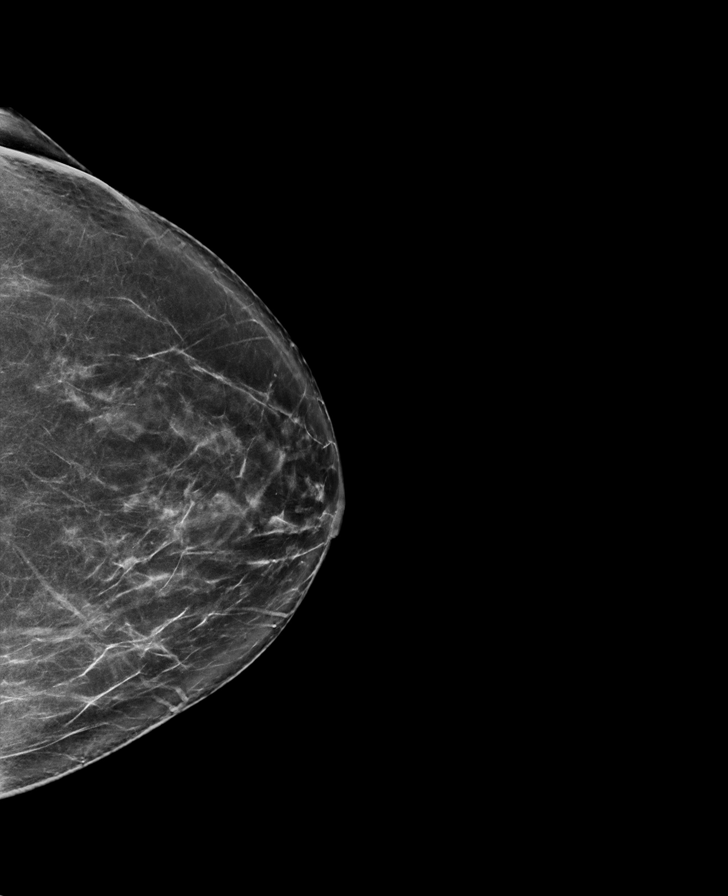

[R MLO tomo · tomo slice 43/84.0]
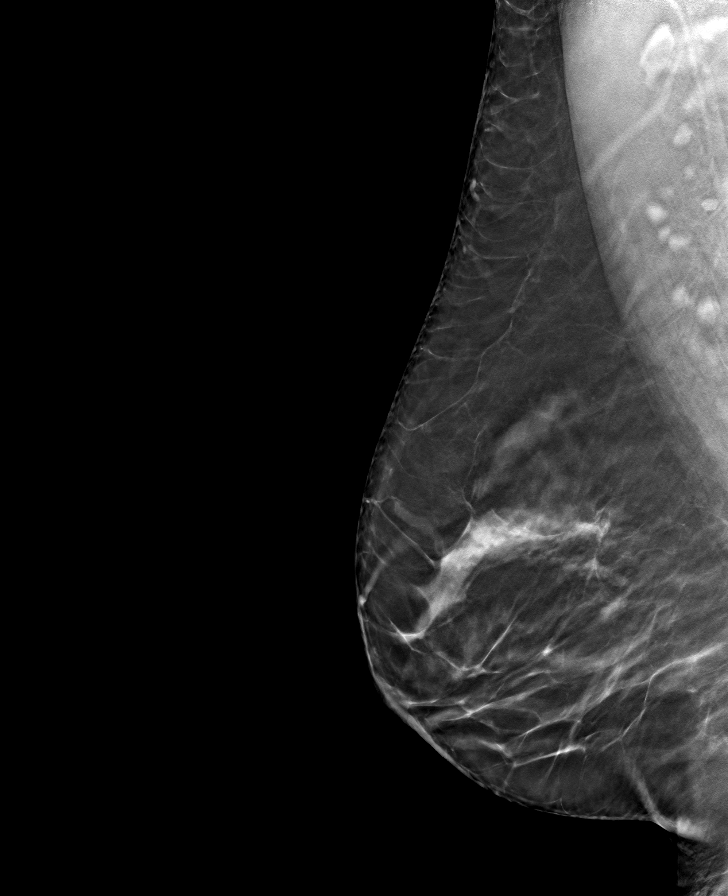

[L CC tomo · tomo slice 38/75.0]
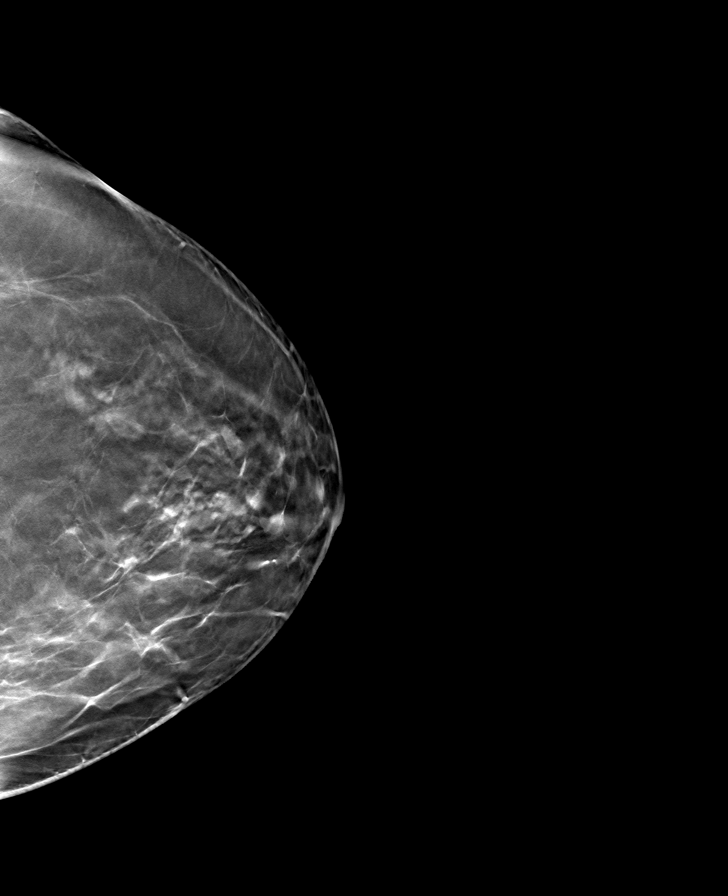

[L MLO tomo · tomo slice 41/81.0]
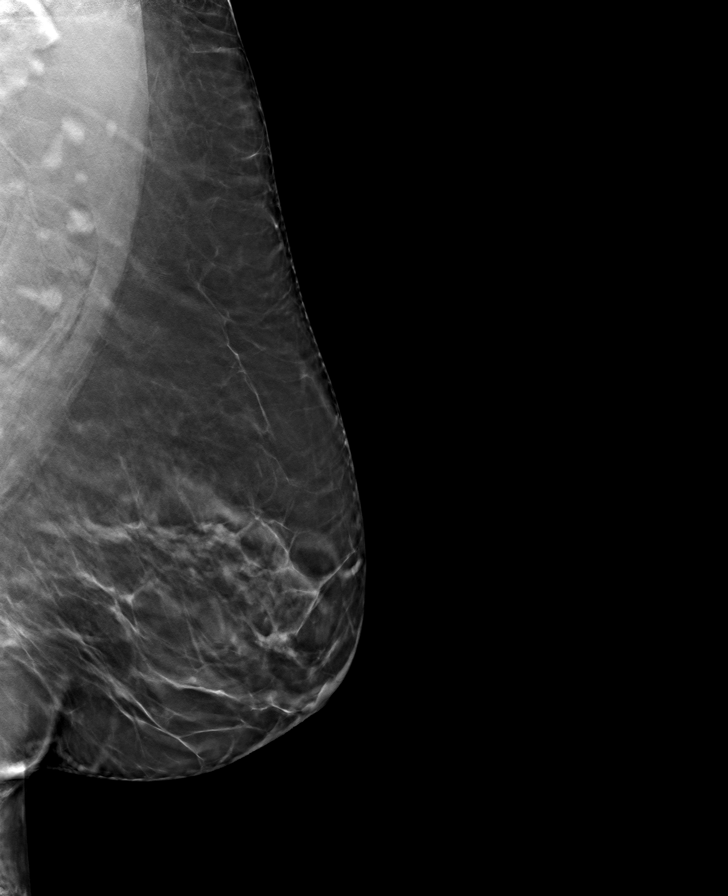

[R CC tomo · tomo slice 39/76.0]
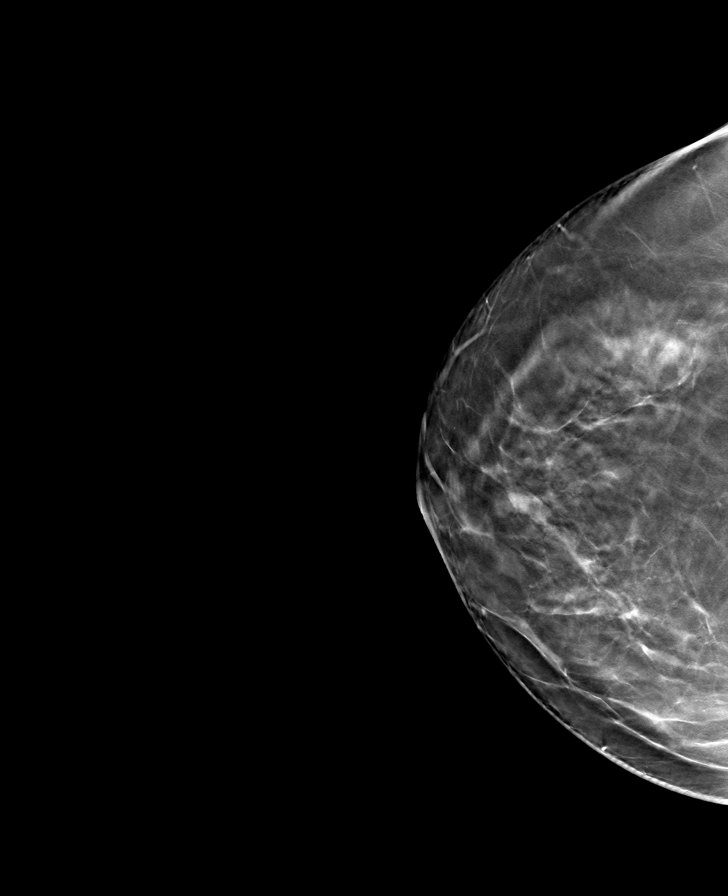

[8 of 24 positions shown; findings below may reference images not displayed]

ACR Breast Density Category b: There are scattered areas of
fibroglandular density.
FINDINGS: There are no findings suspicious for malignancy. Images were
processed with CAD.
IMPRESSION: No mammographic evidence of malignancy. A result letter of this
screening mammogram will be mailed directly to the patient.

RECOMMENDATION:
Screening mammogram in one year. (Code:[id number])

BI-RADS CATEGORY  1: Negative.

## 2021-06-30 ENCOUNTER — Ambulatory Visit: Payer: Commercial Managed Care - PPO | Admitting: Dermatology

## 2021-12-08 ENCOUNTER — Encounter (INDEPENDENT_AMBULATORY_CARE_PROVIDER_SITE_OTHER): Payer: Self-pay

## 2023-07-14 ENCOUNTER — Ambulatory Visit
Admission: RE | Admit: 2023-07-14 | Discharge: 2023-07-14 | Disposition: A | Discharge status: Home / Self Care | Source: Ambulatory Visit | Attending: Family Medicine | Admitting: Family Medicine

## 2023-07-14 ENCOUNTER — Other Ambulatory Visit: Payer: Self-pay | Admitting: Family Medicine

## 2023-07-14 DIAGNOSIS — Z1231 Encounter for screening mammogram for malignant neoplasm of breast: Secondary | ICD-10-CM

## 2024-05-30 ENCOUNTER — Other Ambulatory Visit: Payer: Self-pay | Admitting: Family Medicine

## 2024-05-30 DIAGNOSIS — Z1231 Encounter for screening mammogram for malignant neoplasm of breast: Secondary | ICD-10-CM

## 2024-07-16 ENCOUNTER — Ambulatory Visit
